# Patient Record
Sex: Male | Born: 1980 | Race: Black or African American | Hispanic: No | Marital: Married | Smoking: Never smoker
Health system: Southern US, Community
[De-identification: ages and names within clinical notes are randomized; demographics above are authoritative.]

## PROBLEM LIST (undated history)

## (undated) DIAGNOSIS — I219 Acute myocardial infarction, unspecified: Secondary | ICD-10-CM

## (undated) DIAGNOSIS — I10 Essential (primary) hypertension: Secondary | ICD-10-CM

---

## 2014-03-17 ENCOUNTER — Encounter (HOSPITAL_COMMUNITY): Payer: Self-pay | Admitting: *Deleted

## 2014-03-17 ENCOUNTER — Emergency Department (HOSPITAL_COMMUNITY): Payer: Self-pay

## 2014-03-17 ENCOUNTER — Emergency Department (HOSPITAL_COMMUNITY)
Admission: EM | Admit: 2014-03-17 | Discharge: 2014-03-17 | Disposition: A | Discharge status: Home / Self Care | Payer: Self-pay | Attending: Emergency Medicine | Admitting: Emergency Medicine

## 2014-03-17 DIAGNOSIS — W19XXXA Unspecified fall, initial encounter: Secondary | ICD-10-CM

## 2014-03-17 DIAGNOSIS — W1839XA Other fall on same level, initial encounter: Secondary | ICD-10-CM | POA: Insufficient documentation

## 2014-03-17 DIAGNOSIS — Y990 Civilian activity done for income or pay: Secondary | ICD-10-CM | POA: Insufficient documentation

## 2014-03-17 DIAGNOSIS — S4992XA Unspecified injury of left shoulder and upper arm, initial encounter: Secondary | ICD-10-CM | POA: Insufficient documentation

## 2014-03-17 DIAGNOSIS — S46912A Strain of unspecified muscle, fascia and tendon at shoulder and upper arm level, left arm, initial encounter: Secondary | ICD-10-CM

## 2014-03-17 DIAGNOSIS — Y9389 Activity, other specified: Secondary | ICD-10-CM | POA: Insufficient documentation

## 2014-03-17 DIAGNOSIS — Y9289 Other specified places as the place of occurrence of the external cause: Secondary | ICD-10-CM | POA: Insufficient documentation

## 2014-03-17 DIAGNOSIS — S46012A Strain of muscle(s) and tendon(s) of the rotator cuff of left shoulder, initial encounter: Secondary | ICD-10-CM | POA: Insufficient documentation

## 2014-03-17 LAB — BASIC METABOLIC PANEL
Anion gap: 11 (ref 5–15)
BUN: 13 mg/dL (ref 6–23)
CALCIUM: 9.3 mg/dL (ref 8.4–10.5)
CO2: 29 mEq/L (ref 19–32)
CREATININE: 1.25 mg/dL (ref 0.50–1.35)
Chloride: 101 mEq/L (ref 96–112)
GFR calc Af Amer: 86 mL/min — ABNORMAL LOW (ref >90)
GFR, EST NON AFRICAN AMERICAN: 74 mL/min — AB (ref >90)
GLUCOSE: 82 mg/dL (ref 70–99)
Potassium: 3.8 mEq/L (ref 3.7–5.3)
Sodium: 141 mEq/L (ref 137–147)

## 2014-03-17 LAB — CBC
HEMATOCRIT: 44.1 % (ref 39.0–52.0)
HEMOGLOBIN: 15 g/dL (ref 13.0–17.0)
MCH: 29.2 pg (ref 26.0–34.0)
MCHC: 34 g/dL (ref 30.0–36.0)
MCV: 85.8 fL (ref 78.0–100.0)
Platelets: 200 10*3/uL (ref 150–400)
RBC: 5.14 MIL/uL (ref 4.22–5.81)
RDW: 12.3 % (ref 11.5–15.5)
WBC: 8.3 10*3/uL (ref 4.0–10.5)

## 2014-03-17 LAB — I-STAT TROPONIN, ED: Troponin i, poc: 0.01 ng/mL (ref 0.00–0.08)

## 2014-03-17 MED ORDER — OXYCODONE-ACETAMINOPHEN 5-325 MG PO TABS
1.0000 | ORAL_TABLET | Freq: Once | ORAL | Status: AC
  Administered 2014-03-17: 1 via ORAL
  Filled 2014-03-17: qty 1

## 2014-03-17 MED ORDER — IBUPROFEN 600 MG PO TABS: 600.0000 mg | ORAL_TABLET | Freq: Four times a day (QID) | ORAL | Status: DC | PRN

## 2014-03-17 MED ORDER — OXYCODONE-ACETAMINOPHEN 5-325 MG PO TABS: 1.0000 | ORAL_TABLET | Freq: Four times a day (QID) | ORAL | Status: DC | PRN

## 2014-03-17 NOTE — ED Notes (Signed)
Pt states that he has had shoulder pain that radiates to chest and arm since a fall several weeks ago. NAD noted.

## 2014-03-17 NOTE — Discharge Instructions (Signed)
Shoulder Sprain °A shoulder sprain is the result of damage to the tough, fiber-like tissues (ligaments) that help hold your shoulder in place. The ligaments may be stretched or torn. Besides the main shoulder joint (the ball and socket), there are several smaller joints that connect the bones in this area. A sprain usually involves one of those joints. Most often it is the acromioclavicular (or AC) joint. That is the joint that connects the collarbone (clavicle) and the shoulder blade (scapula) at the top point of the shoulder blade (acromion). °A shoulder sprain is a mild form of what is called a shoulder separation. Recovering from a shoulder sprain may take some time. For some, pain lingers for several months. Most people recover without long term problems. °CAUSES  °· A shoulder sprain is usually caused by some kind of trauma. This might be: °¨ Falling on an outstretched arm. °¨ Being hit hard on the shoulder. °¨ Twisting the arm. °· Shoulder sprains are more likely to occur in people who: °¨ Play sports. °¨ Have balance or coordination problems. °SYMPTOMS  °· Pain when you move your shoulder. °· Limited ability to move the shoulder. °· Swelling and tenderness on top of the shoulder. °· Redness or warmth in the shoulder. °· Bruising. °· A change in the shape of the shoulder. °DIAGNOSIS  °Your healthcare provider may: °· Ask about your symptoms. °· Ask about recent activity that might have caused those symptoms. °· Examine your shoulder. You may be asked to do simple exercises to test movement. The other shoulder will be examined for comparison. °· Order some tests that provide a look inside the body. They can show the extent of the injury. The tests could include: °¨ X-rays. °¨ CT (computed tomography) scan. °¨ MRI (magnetic resonance imaging) scan. °RISKS AND COMPLICATIONS °· Loss of full shoulder motion. °· Ongoing shoulder pain. °TREATMENT  °How long it takes to recover from a shoulder sprain depends on how  severe it was. Treatment options may include: °· Rest. You should not use the arm or shoulder until it heals. °· Ice. For 2 or 3 days after the injury, put an ice pack on the shoulder up to 4 times a day. It should stay on for 15 to 20 minutes each time. Wrap the ice in a towel so it does not touch your skin. °· Over-the-counter medicine to relieve pain. °· A sling or brace. This will keep the arm still while the shoulder is healing. °· Physical therapy or rehabilitation exercises. These will help you regain strength and motion. Ask your healthcare provider when it is OK to begin these exercises. °· Surgery. The need for surgery is rare with a sprained shoulder, but some people may need surgery to keep the joint in place and reduce pain. °HOME CARE INSTRUCTIONS  °· Ask your healthcare provider about what you should and should not do while your shoulder heals. °· Make sure you know how to apply ice to the correct area of your shoulder. °· Talk with your healthcare provider about which medications should be used for pain and swelling. °· If rehabilitation therapy will be needed, ask your healthcare provider to refer you to a therapist. If it is not recommended, then ask about at-home exercises. Find out when exercise should begin. °SEEK MEDICAL CARE IF:  °Your pain, swelling, or redness at the joint increases. °SEEK IMMEDIATE MEDICAL CARE IF:  °· You have a fever. °· You cannot move your arm or shoulder. °Document Released: 08/20/2008 Document   Revised: 06/26/2011 Document Reviewed: 08/20/2008 °[employer]® Patient Information ©2015 [employer]. This information is not intended to replace advice given to you by your health care provider. Make sure you discuss any questions you have with your health care provider. ° °

## 2014-03-17 NOTE — ED Provider Notes (Signed)
CSN: [id number]     Arrival date & time 03/17/14  1848 History   First MD Initiated Contact with Patient 03/17/14 2253     Chief Complaint  Patient presents with  . Chest Pain  . Shoulder Pain     (Consider location/radiation/quality/duration/timing/severity/associated sxs/prior Treatment) Patient is a 33 y.o. male presenting with chest pain and shoulder pain. The history is provided by the patient.  Chest Pain Associated symptoms: no back pain, no fever and no shortness of breath   Shoulder Pain Associated symptoms: no back pain and no fever    patient has had pain in his left shoulder since a fall 2 weeks ago. States that he was at work and fell over a pit and landed on his left elbow. Since then he has had some pain below his left shoulder. Worse with movement. No tenderness on the chest. No difficulty breathing worse with certain movements. No numbness or weakness. It is not gotten better with menthol lotion. No difficulty breathing. No pain in the elbow. No previous history of problems with the shoulder. There is some radiation down the left arm with the pain.  History reviewed. No pertinent past medical history. History reviewed. No pertinent past surgical history. No family history on file. History  Substance Use Topics  . Smoking status: Never Smoker   . Smokeless tobacco: Not on file  . Alcohol Use: No    Review of Systems  Constitutional: Negative for fever and chills.  Respiratory: Negative for shortness of breath.   Cardiovascular: Positive for chest pain.  Musculoskeletal: Negative for back pain.  Skin: Negative for wound.  Neurological: Negative for syncope.      Allergies  Review of patient's allergies indicates not on file.  Home Medications   Prior to Admission medications   Medication Sig Start Date End Date Taking? Authorizing Provider  ibuprofen (ADVIL,MOTRIN) 600 MG tablet Take 1 tablet (600 mg total) by mouth every 6 (six) hours as needed. 03/17/14    Juliet RudeNathan R. Charlies Rayburn, MD  oxyCODONE-acetaminophen (PERCOCET/ROXICET) 5-325 MG per tablet Take 1-2 tablets by mouth every 6 (six) hours as needed for severe pain. 03/17/14   Juliet RudeNathan R. Keyondra Lagrand, MD   BP 147/94 mmHg  Pulse 58  Temp(Src) 98.4 F (36.9 C) (Oral)  Resp 15  SpO2 100% Physical Exam  Constitutional: He is oriented to person, place, and time. He appears well-developed.  Cardiovascular: Normal rate and regular rhythm.   Pulmonary/Chest: Effort normal.  Abdominal: Soft. There is no tenderness.  Musculoskeletal:  Some pain in the left shoulder with external rotation. Mildly decreased abduction at the shoulder. Sensation intact in the hand. Good muscular control with radial median and ulnar nerve. Flexion and extension in the wrist and elbow intact. No tenderness over elbow. Mild tenderness over the axilla laterally on arm. No tenderness on chest.  Neurological: He is alert and oriented to person, place, and time.  Skin: Skin is warm.    ED Course  Procedures (including critical care time) Labs Review Labs Reviewed  BASIC METABOLIC PANEL - Abnormal; Notable for the following:    GFR calc non Af Amer 74 (*)    GFR calc Af Amer 86 (*)    All other components within normal limits  CBC  I-STAT TROPOININ, ED    Imaging Review Dg Shoulder Left  03/17/2014   CLINICAL DATA:  Fall, acute left shoulder pain  EXAM: LEFT SHOULDER - 2+ VIEW  COMPARISON:  None.  FINDINGS: There is no evidence of  fracture or dislocation. There is no evidence of arthropathy or other focal bone abnormality. Soft tissues are unremarkable.  IMPRESSION: Negative.   Electronically Signed   By: Ruel Favorsrevor  Shick M.D.   On: 03/17/2014 19:54     EKG Interpretation   Date/Time:  Tuesday March 17 2014 19:05:59 EST Ventricular Rate:  58 PR Interval:  152 QRS Duration: 90 QT Interval:  386 QTC Calculation: 378 R Axis:   102 Text Interpretation:  Sinus bradycardia with sinus arrhythmia Rightward  axis Borderline  ECG Confirmed by Rubin PayorPICKERING  MD, Harrold DonathNATHAN [phone]([id number]) on  03/17/2014 10:54:59 PM      MDM   Final diagnoses:  Shoulder strain, left, initial encounter    Patient with left shoulder pain after fall. May be rotator cuff. Reassuring x-ray. This does not appear to be of cardiac cause.    Juliet RudeNathan R. Rubin PayorPickering, MD 03/17/14 2311

## 2015-02-23 ENCOUNTER — Encounter (HOSPITAL_COMMUNITY): Payer: Self-pay | Admitting: Emergency Medicine

## 2015-02-23 ENCOUNTER — Emergency Department (HOSPITAL_COMMUNITY)
Admission: EM | Admit: 2015-02-23 | Discharge: 2015-02-23 | Disposition: A | Discharge status: Home / Self Care | Payer: Self-pay | Attending: Emergency Medicine | Admitting: Emergency Medicine

## 2015-02-23 ENCOUNTER — Emergency Department (HOSPITAL_COMMUNITY): Payer: Self-pay

## 2015-02-23 DIAGNOSIS — R0602 Shortness of breath: Secondary | ICD-10-CM | POA: Insufficient documentation

## 2015-02-23 DIAGNOSIS — I10 Essential (primary) hypertension: Secondary | ICD-10-CM | POA: Insufficient documentation

## 2015-02-23 DIAGNOSIS — I252 Old myocardial infarction: Secondary | ICD-10-CM | POA: Insufficient documentation

## 2015-02-23 DIAGNOSIS — R079 Chest pain, unspecified: Secondary | ICD-10-CM | POA: Insufficient documentation

## 2015-02-23 HISTORY — DX: Acute myocardial infarction, unspecified: I21.9

## 2015-02-23 HISTORY — DX: Essential (primary) hypertension: I10

## 2015-02-23 LAB — CBC WITH DIFFERENTIAL/PLATELET
BASOS ABS: 0 10*3/uL (ref 0.0–0.1)
BASOS PCT: 0 %
EOS ABS: 0.1 10*3/uL (ref 0.0–0.7)
Eosinophils Relative: 1 %
HCT: 46.9 % (ref 39.0–52.0)
HEMOGLOBIN: 15.7 g/dL (ref 13.0–17.0)
LYMPHS PCT: 20 %
Lymphs Abs: 2.2 10*3/uL (ref 0.7–4.0)
MCH: 29 pg (ref 26.0–34.0)
MCHC: 33.5 g/dL (ref 30.0–36.0)
MCV: 86.7 fL (ref 78.0–100.0)
MONO ABS: 0.6 10*3/uL (ref 0.1–1.0)
Monocytes Relative: 5 %
Neutro Abs: 7.8 10*3/uL — ABNORMAL HIGH (ref 1.7–7.7)
Neutrophils Relative %: 74 %
Platelets: 189 10*3/uL (ref 150–400)
RBC: 5.41 MIL/uL (ref 4.22–5.81)
RDW: 12.9 % (ref 11.5–15.5)
WBC: 10.6 10*3/uL — AB (ref 4.0–10.5)

## 2015-02-23 LAB — I-STAT TROPONIN, ED
TROPONIN I, POC: 0.01 ng/mL (ref 0.00–0.08)
TROPONIN I, POC: 0.02 ng/mL (ref 0.00–0.08)

## 2015-02-23 LAB — BASIC METABOLIC PANEL
Anion gap: 9 (ref 5–15)
BUN: 7 mg/dL (ref 6–20)
CHLORIDE: 103 mmol/L (ref 101–111)
CO2: 29 mmol/L (ref 22–32)
Calcium: 9.3 mg/dL (ref 8.9–10.3)
Creatinine, Ser: 1.39 mg/dL — ABNORMAL HIGH (ref 0.61–1.24)
GFR calc non Af Amer: >60 mL/min (ref >60)
Glucose, Bld: 96 mg/dL (ref 65–99)
Potassium: 4 mmol/L (ref 3.5–5.1)
Sodium: 141 mmol/L (ref 135–145)

## 2015-02-23 MED ORDER — SODIUM CHLORIDE 0.9 % IV BOLUS (SEPSIS)
1000.0000 mL | Freq: Once | INTRAVENOUS | Status: AC
  Administered 2015-02-23: 1000 mL via INTRAVENOUS

## 2015-02-23 MED ORDER — KETOROLAC TROMETHAMINE 60 MG/2ML IM SOLN: 60.0000 mg | Freq: Once | INTRAMUSCULAR | Status: DC

## 2015-02-23 MED ORDER — OXYCODONE-ACETAMINOPHEN 5-325 MG PO TABS: 1.0000 | ORAL_TABLET | Freq: Once | ORAL | Status: DC

## 2015-02-23 MED ORDER — KETOROLAC TROMETHAMINE 30 MG/ML IJ SOLN
30.0000 mg | Freq: Once | INTRAMUSCULAR | Status: AC
  Administered 2015-02-23: 30 mg via INTRAVENOUS
  Filled 2015-02-23: qty 1

## 2015-02-23 NOTE — ED Provider Notes (Signed)
CSN: [id number]     Arrival date & time 02/23/15  [id number] History   First MD Initiated Contact with Patient 02/23/15 0827     Chief Complaint  Patient presents with  . Chest Pain     (Consider location/radiation/quality/duration/timing/severity/associated sxs/prior Treatment) The history is provided by the patient. No language interpreter was used.  Mr. Nathaniel Lawrence is a 34 year old male who presents with sudden onset throbbing left-sided chest pain while at work today approximately one hour ago. 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 He also complains of mild shortness of breath with the chest throbbing. He states he has had chest pain in the past and had a heart attack when he was 34 years old but denies taking any medications. He states he also had a cardiac cath done and was given some papers but does not know what they did to him or if they placed a stent. When asked what his chest pain was on a scale of 1-10 he stated he was not in pain but that it was more throbbing in nature. He denies any treatment prior to arrival. He said he has high blood pressure but denies taking any medication for this. He denies any diaphoresis, fever, chills, recent illness, abdominal pain, nausea, vomiting, diarrhea, or dysuria. He denies any tobacco use. He denies any significant family history of heart disease.  Past Medical History  Diagnosis Date  . Myocardial infarction acute (HCC)   . Hypertension    History reviewed. No pertinent past surgical history. No family history on file. Social History  Substance Use Topics  . Smoking status: Never Smoker   . Smokeless tobacco: None  . Alcohol Use: No    Review of Systems  Constitutional: Negative for fever and diaphoresis.  Respiratory: Positive for shortness of breath. Negative for cough.   Cardiovascular: Positive for chest pain.  Gastrointestinal: Negative for vomiting, abdominal pain and constipation.  All other systems reviewed and are negative.     Allergies   Review of patient's allergies indicates no known allergies.  Home Medications   Prior to Admission medications   Medication Sig Start Date End Date Taking? Authorizing Provider  aspirin-acetaminophen-caffeine (EXCEDRIN MIGRAINE) [phone] MG tablet Take 1 tablet by mouth every 6 (six) hours as needed for headache.   Yes Historical Provider, MD  ibuprofen (ADVIL,MOTRIN) 600 MG tablet Take 1 tablet (600 mg total) by mouth every 6 (six) hours as needed. Patient not taking: Reported on 02/23/2015 03/17/14   Benjiman CoreNathan Pickering, MD  oxyCODONE-acetaminophen (PERCOCET/ROXICET) 5-325 MG per tablet Take 1-2 tablets by mouth every 6 (six) hours as needed for severe pain. Patient not taking: Reported on 02/23/2015 03/17/14   Benjiman CoreNathan Pickering, MD   BP 91/74 mmHg  Pulse 71  Temp(Src) 98.1 F (36.7 C) (Oral)  Resp 18  Ht 5\' 8"  (1.727 m)  Wt 180 lb (81.647 kg)  BMI 27.38 kg/m2  SpO2 99% Physical Exam  Constitutional: He is oriented to person, place, and time. He appears well-developed and well-nourished. No distress.  HENT:  Head: Normocephalic and atraumatic.  Eyes: Conjunctivae are normal.  Neck: Normal range of motion. Neck supple.  Cardiovascular: Normal rate, regular rhythm and normal heart sounds.   Regular rate and rhythm. No murmurs rubs or gallops.  Pulmonary/Chest: Effort normal and breath sounds normal. No respiratory distress. He has no wheezes. He has no rales.  Lungs clear to auscultation bilaterally. No wheezing or decreased breath sounds. No signs of respiratory distress or shortness of breath. 99% oxygen on  room air. No reproducible chest tenderness to palpation.  Abdominal: Soft. There is no tenderness.  No abdominal tenderness to palpation.  Musculoskeletal: Normal range of motion.  Neurological: He is alert and oriented to person, place, and time.  Skin: Skin is warm and dry.  Nursing note and vitals reviewed.   ED Course  Procedures (including critical care time) Labs  Review Labs Reviewed  CBC WITH DIFFERENTIAL/PLATELET - Abnormal; Notable for the following:    WBC 10.6 (*)    Neutro Abs 7.8 (*)    All other components within normal limits  BASIC METABOLIC PANEL - Abnormal; Notable for the following:    Creatinine, Ser 1.39 (*)    All other components within normal limits  I-STAT TROPOININ, ED  Rosezena Sensor, ED    Imaging Review Dg Chest 2 View  02/23/2015  CLINICAL DATA:  34 year old male with central chest pain radiating to the left side with shortness of breath and intermittent nausea. Initial encounter. EXAM: CHEST  2 VIEW COMPARISON:  None. FINDINGS: Normal lung volumes. Normal cardiac size and mediastinal contours. Visualized tracheal air column is within normal limits. The lungs are clear. Negative visible bowel gas pattern. No osseous abnormality identified. IMPRESSION: Negative, no acute cardiopulmonary abnormality. Electronically Signed   By: Odessa Fleming M.D.   On: 02/23/2015 09:31   I have personally reviewed and evaluated these images and lab results as part of my medical decision-making.   EKG Interpretation   Date/Time:  Tuesday February 23 2015 08:30:57 EST Ventricular Rate:  73 PR Interval:  146 QRS Duration: 91 QT Interval:  366 QTC Calculation: 403 R Axis:   91 Text Interpretation:  Sinus arrhythmia Borderline right axis deviation  Confirmed by BEATON  MD, ROBERT ([id number]) on 02/23/2015 8:40:58 AM      MDM   Final diagnoses:  Chest pain, unspecified chest pain type  Patient presents for throbbing chest pain that began one hour prior to arrival while at work. His vitals are stable and he is well-appearing. He does not appear to be in any respiratory or acute distress at this time. His labs are not concerning. He is mildly dehydrated. His chest x-ray is negative for edema, infiltrate, or pneumothorax. His EKG shows no ST elevation. His troponin is negative. Due to the onset and timing of the pain he will need repeat  troponin. Medications  ketorolac (TORADOL) 30 MG/ML injection 30 mg (30 mg Intravenous Given 02/23/15 0911)  sodium chloride 0.9 % bolus 1,000 mL (0 mLs Intravenous Stopped 02/23/15 1052)   Patient's second troponin is negative. He requested hypertension medication but has never been given any in the past. His blood pressures remained stable while he stayed here. I discussed that he would need to see a primary care physician to determine if he needed blood pressure medication. I reviewed the heart score which was very low risk for acute coronary syndrome. I do not believe this is a PE. I discussed return precautions with the patient as well as follow-up and he verbally agrees with the plan.    Catha Gosselin, PA-C 02/23/15 1500  Nelva Nay, MD 02/24/15 [phone]

## 2015-02-23 NOTE — Discharge Instructions (Signed)
Nonspecific Chest Pain  Follow up with a primary care provider using the resource guide below. Return for chest pain, shortness of breath, or sweating with chest pain. Chest pain can be caused by many different conditions. There is always a chance that your pain could be related to something serious, such as a heart attack or a blood clot in your lungs. Chest pain can also be caused by conditions that are not life-threatening. If you have chest pain, it is very important to follow up with your health care provider. CAUSES  Chest pain can be caused by:  Heartburn.  Pneumonia or bronchitis.  Anxiety or stress.  Inflammation around your heart (pericarditis) or lung (pleuritis or pleurisy).  A blood clot in your lung.  A collapsed lung (pneumothorax). It can develop suddenly on its own (spontaneous pneumothorax) or from trauma to the chest.  Shingles infection (varicella-zoster virus).  Heart attack.  Damage to the bones, muscles, and cartilage that make up your chest wall. This can include:  Bruised bones due to injury.  Strained muscles or cartilage due to frequent or repeated coughing or overwork.  Fracture to one or more ribs.  Sore cartilage due to inflammation (costochondritis). RISK FACTORS  Risk factors for chest pain may include:  Activities that increase your risk for trauma or injury to your chest.  Respiratory infections or conditions that cause frequent coughing.  Medical conditions or overeating that can cause heartburn.  Heart disease or family history of heart disease.  Conditions or health behaviors that increase your risk of developing a blood clot.  Having had chicken pox (varicella zoster). SIGNS AND SYMPTOMS Chest pain can feel like:  Burning or tingling on the surface of your chest or deep in your chest.  Crushing, pressure, aching, or squeezing pain.  Dull or sharp pain that is worse when you move, cough, or take a deep breath.  Pain that is  also felt in your back, neck, shoulder, or arm, or pain that spreads to any of these areas. Your chest pain may come and go, or it may stay constant. DIAGNOSIS Lab tests or other studies may be needed to find the cause of your pain. Your health care provider may have you take a test called an ambulatory ECG (electrocardiogram). An ECG records your heartbeat patterns at the time the test is performed. You may also have other tests, such as:  Transthoracic echocardiogram (TTE). During echocardiography, sound waves are used to create a picture of all of the heart structures and to look at how blood flows through your heart.  Transesophageal echocardiogram (TEE).This is a more advanced imaging test that obtains images from inside your body. It allows your health care provider to see your heart in finer detail.  Cardiac monitoring. This allows your health care provider to monitor your heart rate and rhythm in real time.  Holter monitor. This is a portable device that records your heartbeat and can help to diagnose abnormal heartbeats. It allows your health care provider to track your heart activity for several days, if needed.  Stress tests. These can be done through exercise or by taking medicine that makes your heart beat more quickly.  Blood tests.  Imaging tests. TREATMENT  Your treatment depends on what is causing your chest pain. Treatment may include:  Medicines. These may include:  Acid blockers for heartburn.  Anti-inflammatory medicine.  Pain medicine for inflammatory conditions.  Antibiotic medicine, if an infection is present.  Medicines to dissolve blood clots.  Medicines to treat coronary artery disease.  Supportive care for conditions that do not require medicines. This may include:  Resting.  Applying heat or cold packs to injured areas.  Limiting activities until pain decreases. HOME CARE INSTRUCTIONS  If you were prescribed an antibiotic medicine, finish it  all even if you start to feel better.  Avoid any activities that bring on chest pain.  Do not use any tobacco products, including cigarettes, chewing tobacco, or electronic cigarettes. If you need help quitting, ask your health care provider.  Do not drink alcohol.  Take medicines only as directed by your health care provider.  Keep all follow-up visits as directed by your health care provider. This is important. This includes any further testing if your chest pain does not go away.  If heartburn is the cause for your chest pain, you may be told to keep your head raised (elevated) while sleeping. This reduces the chance that acid will go from your stomach into your esophagus.  Make lifestyle changes as directed by your health care provider. These may include:  Getting regular exercise. Ask your health care provider to suggest some activities that are safe for you.  Eating a heart-healthy diet. A registered dietitian can help you to learn healthy eating options.  Maintaining a healthy weight.  Managing diabetes, if necessary.  Reducing stress. SEEK MEDICAL CARE IF:  Your chest pain does not go away after treatment.  You have a rash with blisters on your chest.  You have a fever. SEEK IMMEDIATE MEDICAL CARE IF:   Your chest pain is worse.  You have an increasing cough, or you cough up blood.  You have severe abdominal pain.  You have severe weakness.  You faint.  You have chills.  You have sudden, unexplained chest discomfort.  You have sudden, unexplained discomfort in your arms, back, neck, or jaw.  You have shortness of breath at any time.  You suddenly start to sweat, or your skin gets clammy.  You feel nauseous or you vomit.  You suddenly feel light-headed or dizzy.  Your heart begins to beat quickly, or it feels like it is skipping beats. These symptoms may represent a serious problem that is an emergency. Do not wait to see if the symptoms will go away.  Get medical help right away. Call your local emergency services (911 in the U.S.). Do not drive yourself to the hospital.   This information is not intended to replace advice given to you by your health care provider. Make sure you discuss any questions you have with your health care provider.   Document Released: 01/11/2005 Document Revised: 04/24/2014 Document Reviewed: 11/07/2013 Elsevier Interactive Patient Education 2016 [employer].  Emergency Department Resource Guide 1) Find a Doctor and Pay Out of Pocket Although you won't have to find out who is covered by your insurance plan, it is a good idea to ask around and get recommendations. You will then need to call the office and see if the doctor you have chosen will accept you as a new patient and what types of options they offer for patients who are self-pay. Some doctors offer discounts or will set up payment plans for their patients who do not have insurance, but you will need to ask so you aren't surprised when you get to your appointment.  2) Contact Your Local Health Department Not all health departments have doctors that can see patients for sick visits, but many do, so it is worth a  call to see if yours does. If you don't know where your local health department is, you can check in your phone book. The CDC also has a tool to help you locate your state's health department, and many state websites also have listings of all of their local health departments.  3) Find a Walk-in Clinic If your illness is not likely to be very severe or complicated, you may want to try a walk in clinic. These are popping up all over the country in pharmacies, drugstores, and shopping centers. They're usually staffed by nurse practitioners or physician assistants that have been trained to treat common illnesses and complaints. They're usually fairly quick and inexpensive. However, if you have serious medical issues or chronic medical problems, these are  probably not your best option.  No Primary Care Doctor: - Call Health Connect at  [phone] - they can help you locate a primary care doctor that  accepts your insurance, provides certain services, etc. - Physician Referral Service- [phone]  Chronic Pain Problems: Organization         Address  Phone   Notes  [facility]  [phone] Patients need to be referred by their primary care doctor.   Medication Assistance: Organization         Address  Phone   Notes  [facility] Medication [facility] [address] [place], [place] [address] [phone] --Must be a resident of [facility] -- Must have NO insurance coverage whatsoever (no Medicaid/ Medicare, etc.) -- The pt. MUST have a primary care doctor that directs their care regularly and follows them in the community   [facility]  [phone]   [employer]  [phone]    Agencies that provide inexpensive medical care: Organization         Address  Phone   Notes  [facility]  [phone]   [facility]    [phone]   [facility] [address] [place], [place] [address] [phone]   [facility] [address] [place] [place] [phone]   Planned Parenthood    [phone]   [facility]    [phone]   [facility]  [address], [place] Phone:  [phone], Fax:  [phone] Hours of Operation:  9 am - 6 pm, M-F.  Also accepts Medicaid/Medicare and self-pay.  [facility]  [address], [place] Phone: [phone], Fax: [phone]. Hours of Operation:  8:30 am - 5:30 pm, M-F.  Also accepts Medicaid and self-pay.  [facility] [place] [address] [place] Phone: [phone]   [facility] [address] Natasha Bence [place], [place] [phone], Ext. [phone] Mondays & Thursdays:  7-9 AM.  First 15 patients are seen on a first come, first serve basis.    Medicaid-accepting [facility] Providers:  Organization         Address  Phone   Notes  [facility] [address], [place] [phone] Also accepts self-pay patients.  [facility] [address] Laurell Josephs [place], [place]  [phone]   [facility] [address], [place] [phone]   [facility] [address] [place] [phone]   Renaye Rakers [address] [place],  [place]   [phone]) [phone] Only accepts [place] Goldman Sachsccess Medicaid patients after they have their name applied to their card.   Self-Pay (no insurance) in [facility]:  Organization         Address  Phone   Notes  Sickle Cell Patients, [facility] [address], [place] [phone]) [phone]   [facility] [address], [place] [phone]   [facility]  [address], [place] [phone]) [phone]   [facility]/Dr. Osei-Bonsu  [address] [place] [address], [place] or [phone], [place] [phone]) [phone] Phone number for both [place] and [place] locations is the same.  [facility] [address], [place] [phone]   [facility]  [address] [place] [address], [place] or [address] [phone]) [phone]) [phone]   [facility] [address], [place] [phone](336) [phone], phone; [phone](336) [phone], fax Sees patients 1st and 3rd Saturday of every month.  Must not qualify for public or private insurance (i.e. Medicaid, Medicare, [place] Health Choice, Veterans' Benefits)  Household income should be no more than 200% of the poverty level The clinic cannot treat you if you are pregnant or think you are pregnant  Sexually transmitted diseases are not treated at the clinic.     Dental Care: Organization         Address  Phone  Notes  [facility] [address], [place] [phone]) [phone] Accepts children up to age 35 who are enrolled in [place]icaid or [place] Health Choice; pregnant women with a Medicaid card; and children who have applied for Medicaid or [place] Health Choice, but were declined, whose parents can pay a reduced fee at time of service.  [facility] [place]  [address], [place] [phone]) [phone] Accepts children up to age 35 who are enrolled in [place]icaid or [place] Health Choice; pregnant women with a Medicaid card; and children who have applied for Medicaid or [place] Health Choice, but were declined, whose parents can pay a reduced fee at time of service.  [facility]  [address], [place] [phone] Patients are seen by appointment only. Walk-ins are not accepted. [facility] will see patients 35 years of age and older. Monday - Tuesday (8am-5pm) Most Wednesdays (8:30-5pm) $30 per visit, cash only  [facility]  [address], [facility] [phone] Patients are seen by appointment only. Walk-ins are not accepted. [facility] will see patients 35 years of age and older. One Wednesday Evening (Monthly: Volunteer Based).  $30 per visit, cash only  [employer] [facility] [facility]  [phone]) [phone] for adults; Children under age 494, call Graduate Pediatric Dentistry at [phone]. Children aged 494-14, please call [phone]) [phone] to request a pediatric application.  Dental services are provided in all areas of dental care including fillings, crowns and bridges, complete and partial dentures, implants, gum treatment, root canals, and extractions. Preventive care is also provided. Treatment is provided to both adults and children. Patients are selected via a lottery and there is often a waiting  list.   [facility] [address], [place]  [phone]) [phone] [email or web address]   [facility] [address], [place], [place] [phone], Ext. [phone] Second and Fourth Thursday of each month,  opens at 6:30 AM; Clinic ends at 9 AM.  Patients are seen on a first-come first-served basis, and a limited number are seen during each clinic.   [facility]  [address], [place], [place] [phone]) [phone]   Eligibility Requirements You must have lived in [place], [place], or [place] counties for at least the last three months.   You cannot be eligible for state or federal sponsored National Cityhealthcare insurance, including CIGNAVeterans Administration, [place]icaid, or [employer] EntertainmentMedicare.   You generally cannot be eligible for healthcare insurance through your employer.    How to apply: Eligibility screenings are held every Tuesday and Wednesday afternoon from 1:00 pm until 4:00 pm. You do not need an appointment for the interview!  [facility] [address], [place], [place] [phone]   [facility]  [phone]   [facility]  [phone]   [facility]  [phone]    Behavioral Health Resources in the Community: Intensive Outpatient Programs Organization         Address  Phone  Notes  [facility] [address], [place], [place] [phone]   [facility] [address], [place], [place] [phone]   [facility] [address], [place], [place]  [phone]   [facility] [address],  [place], [place] [phone] or [phone]   Substance Abuse Resources Organization         Address  Phone  Notes  [facility]  [phone]   [facility]  [phone]   [facility]  [phone]   [facility]  [phone]   Residential & Outpatient Substance Abuse Program   [phone]   Psychological Services Organization         Address  Phone  Notes  [facility]  [phone]- [phone]   [facility]  [phone]- [phone]   [facility] [address], [place] [phone] or [phone]    Mobile Crisis Teams Organization         Address  Phone  Notes  [facility], Mobile Crisis Care Unit  [phone]   [facility]  [address] [place], [place] [phone]   Doristine LocksSharon DeEsch [address] [place] [phone]    Self-Help/Support Groups Organization         Address  Phone             Notes  [facility] [place] - variety of support groups  336- [phone] Call for more information  Narcotics Anonymous (NA), [facility] [address], [place]  2 meetings at this location   Statisticianesidential Treatment Programs Organization         Address  Phone  Notes  [facility] [address],    [place]  [phone]   [facility]  [address], [place] [phone], [place], [place] [phone]   [facility] [address], [place] [phone] Admissions: 8am-3pm M-F  [facility] [address]    [place], [place] [phone]   [facility] [address], [place], [place] [phone]   [facility] [address]  [place], [place] [phone]   [facility] - Intensive Outpatient [address], [place], [place] [phone]   [facility] [address]  [place], [place] [phone]  or [phone]   [facility] ([facility]) [address], [place], [place] [phone] Accepts Medicaid  [facility] [place] [address],  [place] [phone] Substance Abuse/Addiction Treatment   [facility] Organization          Address  Phone  Notes  [facility]  [phone]   Angie Fava, PhD [address] Ervin Knack [place], [place]   [phone] or [phone]   [facility]   [address] [place], [place] [phone]   [facility] [address], [place], [place] [phone] Insurance/Medicaid/sponsorship through [facility] and Families [address]                                    [place], [place] [phone] Therapy/tele-psych/case  [facility] [address] Road[address], [place] [phone]    Dr. Lolly Mustache  [phone]   [facility]  [employer] [facility] Dept. 1) [address] [place] 2) [address], [place] 3)  [address], [place] [phone]  [phone]   [facility] Child Abuse Hotline [phone] or [phone] (After Hours)

## 2015-02-23 NOTE — ED Notes (Signed)
Patient states was at work today and had L chest pain that was throbbing in nature.   Patient states he is still having small amount of pain.  Patient states had a heart attack when he was 27 that was caused by stress.   Denies other symptoms.

## 2015-02-25 ENCOUNTER — Ambulatory Visit (INDEPENDENT_AMBULATORY_CARE_PROVIDER_SITE_OTHER): Payer: Self-pay | Admitting: Family Medicine

## 2015-02-25 VITALS — BP 120/88 | HR 58 | Temp 98.2°F | Resp 18 | Ht 68.0 in | Wt 188.6 lb

## 2015-02-25 DIAGNOSIS — N289 Disorder of kidney and ureter, unspecified: Secondary | ICD-10-CM

## 2015-02-25 DIAGNOSIS — Z09 Encounter for follow-up examination after completed treatment for conditions other than malignant neoplasm: Secondary | ICD-10-CM

## 2015-02-25 DIAGNOSIS — R030 Elevated blood-pressure reading, without diagnosis of hypertension: Secondary | ICD-10-CM

## 2015-02-25 NOTE — Patient Instructions (Signed)
Blood pressure should be less 140/90  Nonspecific Chest Pain  Chest pain can be caused by many different conditions. There is always a chance that your pain could be related to something serious, such as a heart attack or a blood clot in your lungs. Chest pain can also be caused by conditions that are not life-threatening. If you have chest pain, it is very important to follow up with your health care provider. CAUSES  Chest pain can be caused by:  Heartburn.  Pneumonia or bronchitis.  Anxiety or stress.  Inflammation around your heart (pericarditis) or lung (pleuritis or pleurisy).  A blood clot in your lung.  A collapsed lung (pneumothorax). It can develop suddenly on its own (spontaneous pneumothorax) or from trauma to the chest.  Shingles infection (varicella-zoster virus).  Heart attack.  Damage to the bones, muscles, and cartilage that make up your chest wall. This can include:  Bruised bones due to injury.  Strained muscles or cartilage due to frequent or repeated coughing or overwork.  Fracture to one or more ribs.  Sore cartilage due to inflammation (costochondritis). RISK FACTORS  Risk factors for chest pain may include:  Activities that increase your risk for trauma or injury to your chest.  Respiratory infections or conditions that cause frequent coughing.  Medical conditions or overeating that can cause heartburn.  Heart disease or family history of heart disease.  Conditions or health behaviors that increase your risk of developing a blood clot.  Having had chicken pox (varicella zoster). SIGNS AND SYMPTOMS Chest pain can feel like:  Burning or tingling on the surface of your chest or deep in your chest.  Crushing, pressure, aching, or squeezing pain.  Dull or sharp pain that is worse when you move, cough, or take a deep breath.  Pain that is also felt in your back, neck, shoulder, or arm, or pain that spreads to any of these areas. Your chest pain  may come and go, or it may stay constant. DIAGNOSIS Lab tests or other studies may be needed to find the cause of your pain. Your health care provider may have you take a test called an ambulatory ECG (electrocardiogram). An ECG records your heartbeat patterns at the time the test is performed. You may also have other tests, such as:  Transthoracic echocardiogram (TTE). During echocardiography, sound waves are used to create a picture of all of the heart structures and to look at how blood flows through your heart.  Transesophageal echocardiogram (TEE).This is a more advanced imaging test that obtains images from inside your body. It allows your health care provider to see your heart in finer detail.  Cardiac monitoring. This allows your health care provider to monitor your heart rate and rhythm in real time.  Holter monitor. This is a portable device that records your heartbeat and can help to diagnose abnormal heartbeats. It allows your health care provider to track your heart activity for several days, if needed.  Stress tests. These can be done through exercise or by taking medicine that makes your heart beat more quickly.  Blood tests.  Imaging tests. TREATMENT  Your treatment depends on what is causing your chest pain. Treatment may include:  Medicines. These may include:  Acid blockers for heartburn.  Anti-inflammatory medicine.  Pain medicine for inflammatory conditions.  Antibiotic medicine, if an infection is present.  Medicines to dissolve blood clots.  Medicines to treat coronary artery disease.  Supportive care for conditions that do not require medicines. This  may include:  Resting.  Applying heat or cold packs to injured areas.  Limiting activities until pain decreases. HOME CARE INSTRUCTIONS  If you were prescribed an antibiotic medicine, finish it all even if you start to feel better.  Avoid any activities that bring on chest pain.  Do not use any  tobacco products, including cigarettes, chewing tobacco, or electronic cigarettes. If you need help quitting, ask your health care provider.  Do not drink alcohol.  Take medicines only as directed by your health care provider.  Keep all follow-up visits as directed by your health care provider. This is important. This includes any further testing if your chest pain does not go away.  If heartburn is the cause for your chest pain, you may be told to keep your head raised (elevated) while sleeping. This reduces the chance that acid will go from your stomach into your esophagus.  Make lifestyle changes as directed by your health care provider. These may include:  Getting regular exercise. Ask your health care provider to suggest some activities that are safe for you.  Eating a heart-healthy diet. A registered dietitian can help you to learn healthy eating options.  Maintaining a healthy weight.  Managing diabetes, if necessary.  Reducing stress. SEEK MEDICAL CARE IF:  Your chest pain does not go away after treatment.  You have a rash with blisters on your chest.  You have a fever. SEEK IMMEDIATE MEDICAL CARE IF:   Your chest pain is worse.  You have an increasing cough, or you cough up blood.  You have severe abdominal pain.  You have severe weakness.  You faint.  You have chills.  You have sudden, unexplained chest discomfort.  You have sudden, unexplained discomfort in your arms, back, neck, or jaw.  You have shortness of breath at any time.  You suddenly start to sweat, or your skin gets clammy.  You feel nauseous or you vomit.  You suddenly feel light-headed or dizzy.  Your heart begins to beat quickly, or it feels like it is skipping beats. These symptoms may represent a serious problem that is an emergency. Do not wait to see if the symptoms will go away. Get medical help right away. Call your local emergency services (911 in the [place] Do not drive yourself  to the hospital.   This information is not intended to replace advice given to you by your health care provider. Make sure you discuss any questions you have with your health care provider.   Document Released: 01/11/2005 Document Revised: 04/24/2014 Document Reviewed: 11/07/2013 Elsevier Interactive Patient Education [employer]

## 2015-02-25 NOTE — Progress Notes (Signed)
Chief Complaint:  Chief Complaint  Patient presents with  . Hospitalization Follow-up    bp    HPI: Nathaniel Lawrence is a 34 y.o. male who reports to [facility] today complaining of  ED fu, He was there for CP and is currently asymptomatic and doing well. CP work up negative in ER   He is not having chest pain currently, he feels like earlier he had some pressure and throbbing pain. He had a headache but no nausea or vomiting. He was seen in ED and 2 sets of troponin were normal  His BP 144/92, 149/93, 153/91in the ER at [facility] ER  He is not sure if he had a heart attack at [facility] in [place] in 2010, he was supposed to get a pacemaker but did not have unsurance so sdid not get it.He is not sure if he had a heart attack when he was at [facility], they did something through his groin but no dc with meds.  He denies diabetes, hyperlipidemia, thyroid disease, early family hx of heart disease; he is a non smoker. He does not drink or use illicit drugs   BP Readings from Last 3 Encounters:  02/25/15 120/88  02/23/15 91/74  03/17/14 145/95   Wt Readings from Last 3 Encounters:  02/25/15 188 lb 9.6 oz (85.548 kg)  02/23/15 180 lb (81.647 kg)    Past Medical History  Diagnosis Date  . Myocardial infarction acute (HCC)   . Hypertension    History reviewed. No pertinent past surgical history. Social History   Social History  . Marital Status: Single    Spouse Name: N/A  . Number of Children: N/A  . Years of Education: N/A   Social History Main Topics  . Smoking status: Never Smoker   . Smokeless tobacco: None  . Alcohol Use: No  . Drug Use: No  . Sexual Activity: Not Asked   Other Topics Concern  . None   Social History Narrative   History reviewed. No pertinent family history. No Known Allergies Prior to Admission medications   Medication Sig Start Date End Date Taking? Authorizing Provider  aspirin-acetaminophen-caffeine (EXCEDRIN MIGRAINE) [phone] MG tablet Take 1 tablet  by mouth every 6 (six) hours as needed for headache.   Yes Historical Provider, MD  ibuprofen (ADVIL,MOTRIN) 600 MG tablet Take 1 tablet (600 mg total) by mouth every 6 (six) hours as needed. 03/17/14  Yes Benjiman Core, MD  oxyCODONE-acetaminophen (PERCOCET/ROXICET) 5-325 MG per tablet Take 1-2 tablets by mouth every 6 (six) hours as needed for severe pain. 03/17/14  Yes Benjiman Core, MD     ROS: The patient denies fevers, chills, night sweats, unintentional weight loss, chest pain, palpitations, wheezing, dyspnea on exertion, nausea, vomiting, abdominal pain, dysuria, hematuria, melena, numbness, weakness, or tingling.   All other systems have been reviewed and were otherwise negative with the exception of those mentioned in the HPI and as above.    PHYSICAL EXAM: Filed Vitals:   02/25/15 1626  BP: 120/88  Pulse: 58  Temp: 98.2 F (36.8 C)  Resp: 18   Body mass index is 28.68 kg/(m^2).   General: Alert, no acute distress HEENT:  Normocephalic, atraumatic, oropharynx patent. EOMI, PERRLA, fundo exam normal Cardiovascular:  Regular rate and rhythm, no rubs murmurs or gallops.  No Carotid bruits, radial pulse intact. No pedal edema.  Respiratory: Clear to auscultation bilaterally.  No wheezes, rales, or rhonchi.  No cyanosis, no use of accessory musculature Abdominal: No  organomegaly, abdomen is soft and non-tender, positive bowel sounds. No masses. Skin: No rashes. Neurologic: Facial musculature symmetric. CN 2-12 grossly normal  Psychiatric: Patient acts appropriately throughout our interaction. Lymphatic: No cervical or submandibular lymphadenopathy Musculoskeletal: Gait intact. No edema, tenderness   LABS: Results for orders placed or performed during the hospital encounter of 02/23/15  CBC with Differential  Result Value Ref Range   WBC 10.6 (H) 4.0 - 10.5 K/uL   RBC 5.41 4.22 - 5.81 MIL/uL   Hemoglobin 15.7 13.0 - 17.0 g/dL   HCT 29.5 62.1 - 30.8 %   MCV 86.7 78.0  - 100.0 fL   MCH 29.0 26.0 - 34.0 pg   MCHC 33.5 30.0 - 36.0 g/dL   RDW 65.7 84.6 - 96.2 %   Platelets 189 150 - 400 K/uL   Neutrophils Relative % 74 %   Neutro Abs 7.8 (H) 1.7 - 7.7 K/uL   Lymphocytes Relative 20 %   Lymphs Abs 2.2 0.7 - 4.0 K/uL   Monocytes Relative 5 %   Monocytes Absolute 0.6 0.1 - 1.0 K/uL   Eosinophils Relative 1 %   Eosinophils Absolute 0.1 0.0 - 0.7 K/uL   Basophils Relative 0 %   Basophils Absolute 0.0 0.0 - 0.1 K/uL  Basic metabolic panel  Result Value Ref Range   Sodium 141 135 - 145 mmol/L   Potassium 4.0 3.5 - 5.1 mmol/L   Chloride 103 101 - 111 mmol/L   CO2 29 22 - 32 mmol/L   Glucose, Bld 96 65 - 99 mg/dL   BUN 7 6 - 20 mg/dL   Creatinine, Ser 9.52 (H) 0.61 - 1.24 mg/dL   Calcium 9.3 8.9 - 84.1 mg/dL   GFR calc non Af Amer >60 >60 mL/min   GFR calc Af Amer >60 >60 mL/min   Anion gap 9 5 - 15  I-Stat Troponin, ED (not at [facility])  Result Value Ref Range   Troponin i, poc 0.01 0.00 - 0.08 ng/mL   Comment 3          I-Stat Troponin, ED (not at [facility])  Result Value Ref Range   Troponin i, poc 0.02 0.00 - 0.08 ng/mL   Comment 3             EKG/XRAY:   Primary read interpreted by Dr. Conley Rolls at [facility].   ASSESSMENT/PLAN: Encounter Diagnoses  Name Primary?  . Elevated blood pressure reading without diagnosis of hypertension Yes  . Hospital discharge follow-up   . Abnormal kidney function    Nathaniel Lawrence is a pleasant 34 y.o with a PMH of atypical CP and elevated BP , may have been at [facility] for an MI but sounds more like cardiac cath since he was never dc with any meds in 2010, he is here after DC form [facility] ER for chest pain rusle out. They were unable to give him any BP meds , hes tates he never was on BP meds before. He currently is asymptomatic and BP is WNL.  I have advised him to use his GF's mom;s BP cuff and keep BP and puls elog, if he needs meds we can start him on norvasc since he doe snot like needles and cannot afford to get Blood work every 6  months since no insurance.  Consider Norvasc prn [facility] from [facility] will be obtained, signed release of records.  Fu by phone in 1 week or sooner prn, go to ER prn   Gross sideeffects, risk and benefits,  and alternatives of medications d/w patient. Patient is aware that all medications have potential sideeffects and we are unable to predict every sideeffect or drug-drug interaction that may occur.  Johnattan Strassman DO  02/25/2015 6:30 PM   03/02/15-lm to call us back about BO and pulse logs, if need meds then will prscribe.

## 2015-07-04 ENCOUNTER — Encounter (HOSPITAL_COMMUNITY): Payer: Self-pay | Admitting: *Deleted

## 2015-07-04 ENCOUNTER — Emergency Department (HOSPITAL_COMMUNITY)
Admission: EM | Admit: 2015-07-04 | Discharge: 2015-07-04 | Disposition: A | Discharge status: Home / Self Care | Payer: Self-pay | Attending: Emergency Medicine | Admitting: Emergency Medicine

## 2015-07-04 DIAGNOSIS — I252 Old myocardial infarction: Secondary | ICD-10-CM | POA: Insufficient documentation

## 2015-07-04 DIAGNOSIS — K0381 Cracked tooth: Secondary | ICD-10-CM | POA: Insufficient documentation

## 2015-07-04 DIAGNOSIS — R51 Headache: Secondary | ICD-10-CM | POA: Insufficient documentation

## 2015-07-04 DIAGNOSIS — I10 Essential (primary) hypertension: Secondary | ICD-10-CM | POA: Insufficient documentation

## 2015-07-04 DIAGNOSIS — K029 Dental caries, unspecified: Secondary | ICD-10-CM | POA: Insufficient documentation

## 2015-07-04 DIAGNOSIS — K0889 Other specified disorders of teeth and supporting structures: Secondary | ICD-10-CM | POA: Insufficient documentation

## 2015-07-04 MED ORDER — PENICILLIN V POTASSIUM 500 MG PO TABS: 500.0000 mg | ORAL_TABLET | Freq: Four times a day (QID) | ORAL | Status: AC

## 2015-07-04 MED ORDER — NAPROXEN 500 MG PO TABS: 500.0000 mg | ORAL_TABLET | Freq: Two times a day (BID) | ORAL | Status: DC

## 2015-07-04 MED ORDER — BUPIVACAINE-EPINEPHRINE (PF) 0.5% -1:200000 IJ SOLN
1.8000 mL | Freq: Once | INTRAMUSCULAR | Status: AC
Start: 2015-07-04 — End: 2015-07-04
  Administered 2015-07-04: 1.8 mL
  Filled 2015-07-04: qty 1.8

## 2015-07-04 MED ORDER — NAPROXEN 250 MG PO TABS
500.0000 mg | ORAL_TABLET | Freq: Once | ORAL | Status: AC
  Administered 2015-07-04: 500 mg via ORAL
  Filled 2015-07-04: qty 2

## 2015-07-04 MED ORDER — PENICILLIN V POTASSIUM 250 MG PO TABS
500.0000 mg | ORAL_TABLET | Freq: Once | ORAL | Status: AC
  Administered 2015-07-04: 500 mg via ORAL
  Filled 2015-07-04: qty 2

## 2015-07-04 NOTE — ED Provider Notes (Signed)
CSN: [id number]     Arrival date & time 07/04/15  0129 History   First MD Initiated Contact with Patient 07/04/15 [id number]     Chief Complaint  Patient presents with  . Dental Pain     (Consider location/radiation/quality/duration/timing/severity/associated sxs/prior Treatment) Patient is a 35 y.o. male presenting with tooth pain. The history is provided by the patient. No language interpreter was used.  Dental Pain Location:  Upper Upper teeth location:  15/LU 2nd molar Quality:  Aching, sharp and throbbing Severity:  Moderate Onset quality:  Sudden Duration:  3 days Timing:  Constant Progression:  Waxing and waning Chronicity:  New Context: dental fracture   Context comment:  States he was eating when a part of his tooth broke Relieved by:  Nothing Worsened by:  Touching and pressure Ineffective treatments: excedrin migraine. Associated symptoms: facial pain   Associated symptoms: no difficulty swallowing, no drooling, no facial swelling, no fever, no oral bleeding, no oral lesions and no trismus   Risk factors: lack of dental care   Risk factors: no smoking     Past Medical History  Diagnosis Date  . Myocardial infarction acute (HCC)   . Hypertension    History reviewed. No pertinent past surgical history. No family history on file. Social History  Substance Use Topics  . Smoking status: Never Smoker   . Smokeless tobacco: None  . Alcohol Use: No    Review of Systems  Constitutional: Negative for fever.  HENT: Positive for dental problem. Negative for drooling, facial swelling and mouth sores.   All other systems reviewed and are negative.   Allergies  Review of patient's allergies indicates no known allergies.  Home Medications   Prior to Admission medications   Medication Sig Start Date End Date Taking? Authorizing Provider  naproxen (NAPROSYN) 500 MG tablet Take 1 tablet (500 mg total) by mouth 2 (two) times daily. 07/04/15   Antony Madura, PA-C  penicillin  v potassium (VEETID) 500 MG tablet Take 1 tablet (500 mg total) by mouth 4 (four) times daily. 07/04/15 07/11/15  Antony Madura, PA-C   BP 146/116 mmHg  Pulse 72  Temp(Src) 98.5 F (36.9 C) (Oral)  Resp 16  SpO2 95%   Physical Exam  Constitutional: He is oriented to person, place, and time. He appears well-developed and well-nourished. No distress.  Nontoxic appearing  HENT:  Head: Normocephalic and atraumatic.  Mouth/Throat: Uvula is midline, oropharynx is clear and moist and mucous membranes are normal. No trismus in the jaw. Dental caries present.    No gingival fluctuance or trismus. Uvula midline. Patient tolerating secretions without difficulty. No facial swelling.  Eyes: Conjunctivae and EOM are normal. No scleral icterus.  Neck: Normal range of motion.  No nuchal rigidity or meningismus  Pulmonary/Chest: Effort normal. No respiratory distress.  Musculoskeletal: Normal range of motion.  Neurological: He is alert and oriented to person, place, and time.  Skin: Skin is warm and dry. No rash noted. He is not diaphoretic. No erythema. No pallor.  Psychiatric: He has a normal mood and affect. His behavior is normal.  Nursing note and vitals reviewed.   ED Course  Dental Date/Time: 07/04/2015 2:44 AM Performed by: Antony Madura Authorized by: Antony Madura Consent: The procedure was performed in an emergent situation. Verbal consent obtained. Written consent not obtained. Risks and benefits: risks, benefits and alternatives were discussed Consent given by: patient Patient understanding: patient states understanding of the procedure being performed Patient consent: the patient's understanding of the procedure  matches consent given Procedure consent: procedure consent matches procedure scheduled Relevant documents: relevant documents present and verified Test results: test results available and properly labeled Site marked: the operative site was marked Imaging studies: imaging  studies available Required items: required blood products, implants, devices, and special equipment available Patient identity confirmed: verbally with patient and arm band Time out: Immediately prior to procedure a "time out" was called to verify the correct patient, procedure, equipment, support staff and site/side marked as required. Preparation: Patient was prepped and draped in the usual sterile fashion. Local anesthesia used: yes Anesthesia: nerve block Local anesthetic: bupivacaine 0.5% with epinephrine Anesthetic total: 1 ml Patient sedated: no Patient tolerance: Patient tolerated the procedure well with no immediate complications Comments: PSA block for dentalgia.   (including critical care time) Labs Review Labs Reviewed - No data to display  Imaging Review No results found. I have personally reviewed and evaluated these images and lab results as part of my medical decision-making.   EKG Interpretation None      MDM   Final diagnoses:  Dentalgia    Patient with toothache. No gross abscess. Exam unconcerning for Ludwig's angina or spread of infection. Will treat with penicillin and NSAIDs. Urged patient to follow-up with dentist. Resource guide and return precautions given. Patient discharged in satisfactory condition with no unaddressed concerns.     Antony MaduraKelly Arthor Gorter, PA-C 07/04/15 [id number]  Derwood KaplanAnkit Nanavati, MD 07/04/15 [phone]

## 2015-07-04 NOTE — ED Notes (Signed)
Pt left with all his belongings and ambulated out of the treatment area.  

## 2015-07-04 NOTE — ED Notes (Signed)
The pt is c/o a toothache for 3-4 days swelling lt jaw

## 2015-07-04 NOTE — Discharge Instructions (Signed)
Dental Pain °Dental pain may be caused by many things, including: °· Tooth decay (cavities or caries). Cavities expose the nerve of your tooth to air and hot or cold temperatures. This can cause pain or discomfort. °· Abscess or infection. A dental abscess is a collection of infected pus from a bacterial infection in the inner part of the tooth (pulp). It usually occurs at the end of the tooth's root. °· Injury. °· An unknown reason (idiopathic). °Your pain may be mild or severe. It may only occur when: °· You are chewing. °· You are exposed to hot or cold temperature. °· You are eating or drinking sugary foods or beverages, such as soda or candy. °Your pain may also be constant. °HOME CARE INSTRUCTIONS °Watch your dental pain for any changes. The following actions may help to lessen any discomfort that you are feeling: °· Take medicines only as directed by your dentist. °· If you were prescribed an antibiotic medicine, finish all of it even if you start to feel better. °· Keep all follow-up visits as directed by your dentist. This is important. °· Do not apply heat to the outside of your face. °· Rinse your mouth or gargle with salt water if directed by your dentist. This helps with pain and swelling. °¨ You can make salt water by adding ¼ tsp of salt to 1 cup of warm water. °· Apply ice to the painful area of your face: °¨ Put ice in a plastic bag. °¨ Place a towel between your skin and the bag. °¨ Leave the ice on for 20 minutes, 2-3 times per day. °· Avoid foods or drinks that cause you pain, such as: °¨ Very hot or very cold foods or drinks. °¨ Sweet or sugary foods or drinks. °SEEK MEDICAL CARE IF: °· Your pain is not controlled with medicines. °· Your symptoms are worse. °· You have new symptoms. °SEEK IMMEDIATE MEDICAL CARE IF: °· You are unable to open your mouth. °· You are having trouble breathing or swallowing. °· You have a fever. °· Your face, neck, or jaw is swollen. °  °This information is not  intended to replace advice given to you by your health care provider. Make sure you discuss any questions you have with your health care provider. °  °Document Released: 04/03/2005 Document Revised: 08/18/2014 Document Reviewed: 03/30/2014 °Elsevier Interactive Patient Education ©2016 Elsevier Inc. ° °Community Resource Guide Dental °The United Way’s “211” is a great source of information about community services available.  Access by dialing 2-1-1 from anywhere in [place], or by website -  www.nc211.org.  ° °Other Local Resources (Updated 04/2015) ° °Dental  Care °  °Services ° °  °Phone Number and Address  °Cost  °[facility] For children 0 - 21 years of age:  °• Cleaning °• Tooth brushing/flossing instruction °• Sealants, fillings, crowns °• Extractions °• Emergency treatment  [phone] °[address] °[place], [place] [address] Charges based on family income.  Medicaid and some insurance plans accepted.   °  °[facility] [place] • Cleaning °• Sealants, fillings, crowns °• Extractions °• Emergency treatment [phone] °[address] °[place], [place] ° Pregnant women 18 years of age or older with a Medicaid card  °[facility] [place] • Cleaning °• Sealants, fillings, crowns °• Extractions °• Emergency treatment [phone] °[address] °[place], [place] Pregnant women 18 years of age or older with a   Medicaid card  °[facility] For children 0 - 21 years of age:  °• Cleaning °• Tooth brushing/flossing instruction °• Sealants, fillings, crowns °• Extractions °• Emergency treatment °Limited orthodontic services for patients with Medicaid [phone] °[address] °[place], [place] [address] Medicaid and [place] Health Choice cover for children up to age 21 and pregnant women.  Parents of children up to age 21 without Medicaid pay a  reduced fee at time of service.  °[facility] [place] For children 0 - 21 years of age:  °• Cleaning °• Tooth brushing/flossing instruction °• Sealants, fillings, crowns °• Extractions °• Emergency treatment °Limited orthodontic services for patients with Medicaid [phone] °[address] °[place], [place].  Medicaid and [place] Health Choice cover for children up to age 21 and pregnant women.  Parents of children up to age 21 without Medicaid pay a reduced fee.  °[facility] • Cleaning °• Sealants, fillings, crowns °• Extractions ° °Hours: Tuesdays and Thursdays, 4:15 - 8 pm [phone] °[address] °[place], [place] [address] Services free of charge to [place] residents ages 18-64 who do not have health insurance, Medicare, Medicaid, or VA benefits and fall within federal poverty guidelines  °[facility] ° ° ° Provides dental care in addition to primary medical care, nutritional counseling, and pharmacy: °• Cleaning °• Sealants, fillings, crowns °• Extractions ° ° ° ° ° ° ° ° ° ° ° ° ° ° ° ° ° [phone] °[facility], [address] °[place], [place] ° °[phone] °[facility], [address] [place], [place] ° °[phone] °[facility] °[place], [place] ° °[phone] °[facility], [address] °[place], [place] ° °[phone] °[facility] °[address] °[place], [place] Accepts Medicaid, Medicare, most insurance.  Also provides services available to all with fees adjusted based on ability to pay.    °[facility] • Cleaning °• Tooth brushing/flossing instruction °• Sealants, fillings, crowns °• Extractions °• Emergency treatment °Hours: Tuesdays, Thursdays, and Fridays from 8 am to 5 pm by appointment only. [phone] °[address] °[place], [place]  [address] [place] residents with Medicaid (depending on eligibility) and children with [place] Health Choice - call for more information.  °[facility] • Extractions only ° °Hours: 2nd and 4th Thursday of each month from 6:30 am - 9 am.   [phone] ext. [phone] °[address] °[place], [place] [address] Ages 18 and older only.  Patients are seen on a first come, first served basis.  °[facility] • Cleanings °• Fillings °• Extractions °• Orthodontics °• Endodontics °• Implants/Crowns/Bridges °• Complete and partial dentures [phone] °[place], [place] Patients must complete an application for services.  There is often a waiting list.   ° °

## 2017-09-19 ENCOUNTER — Encounter (HOSPITAL_COMMUNITY): Payer: Self-pay | Admitting: *Deleted

## 2017-09-19 ENCOUNTER — Emergency Department (HOSPITAL_COMMUNITY): Payer: Self-pay

## 2017-09-19 ENCOUNTER — Emergency Department (HOSPITAL_COMMUNITY)
Admission: EM | Admit: 2017-09-19 | Discharge: 2017-09-19 | Disposition: A | Discharge status: Home / Self Care | Payer: Self-pay | Attending: Emergency Medicine | Admitting: Emergency Medicine

## 2017-09-19 DIAGNOSIS — I10 Essential (primary) hypertension: Secondary | ICD-10-CM | POA: Insufficient documentation

## 2017-09-19 DIAGNOSIS — Y999 Unspecified external cause status: Secondary | ICD-10-CM | POA: Insufficient documentation

## 2017-09-19 DIAGNOSIS — I252 Old myocardial infarction: Secondary | ICD-10-CM | POA: Insufficient documentation

## 2017-09-19 DIAGNOSIS — X58XXXA Exposure to other specified factors, initial encounter: Secondary | ICD-10-CM | POA: Insufficient documentation

## 2017-09-19 DIAGNOSIS — Y929 Unspecified place or not applicable: Secondary | ICD-10-CM | POA: Insufficient documentation

## 2017-09-19 DIAGNOSIS — Z79899 Other long term (current) drug therapy: Secondary | ICD-10-CM | POA: Insufficient documentation

## 2017-09-19 DIAGNOSIS — Y9302 Activity, running: Secondary | ICD-10-CM | POA: Insufficient documentation

## 2017-09-19 DIAGNOSIS — S76111A Strain of right quadriceps muscle, fascia and tendon, initial encounter: Secondary | ICD-10-CM | POA: Insufficient documentation

## 2017-09-19 MED ORDER — HYDROCODONE-ACETAMINOPHEN 5-325 MG PO TABS: 1.0000 | ORAL_TABLET | Freq: Four times a day (QID) | ORAL | 0 refills | Status: DC | PRN

## 2017-09-19 MED ORDER — IBUPROFEN 800 MG PO TABS: 800.0000 mg | ORAL_TABLET | Freq: Three times a day (TID) | ORAL | 0 refills | Status: DC | PRN

## 2017-09-19 MED ORDER — OXYCODONE-ACETAMINOPHEN 5-325 MG PO TABS
1.0000 | ORAL_TABLET | Freq: Once | ORAL | Status: AC
  Administered 2017-09-19: 1 via ORAL
  Filled 2017-09-19: qty 1

## 2017-09-19 MED ORDER — MORPHINE SULFATE (PF) 4 MG/ML IV SOLN
4.0000 mg | Freq: Once | INTRAVENOUS | Status: AC
  Administered 2017-09-19: 4 mg via INTRAVENOUS
  Filled 2017-09-19: qty 1

## 2017-09-19 NOTE — ED Triage Notes (Signed)
States he was running last Monday and felt a pain in his right upper thigh. C/o bruising to thigh and states pain is getting worse. Cramping in right thigh

## 2017-09-19 NOTE — ED Notes (Signed)
Pt back from MRI.  States morphine did not touch pain.

## 2017-09-19 NOTE — ED Notes (Signed)
ED Provider at bedside. 

## 2017-09-19 NOTE — Discharge Instructions (Signed)
Return here as needed. Follow up with the orthopedist provided. Ice and heat to the area that is sore. Elevate the leg as well.

## 2017-09-19 NOTE — Progress Notes (Signed)
Orthopedic Tech Progress Note Patient Details:  Erma Pintoric Tennyson January 07, 1981 [id number]  Ortho Devices Type of Ortho Device: Crutches, Knee Immobilizer Ortho Device/Splint Interventions: Application   Post Interventions Patient Tolerated: Well, Ambulated well Instructions Provided: Care of device, Adjustment of device, Poper ambulation with device   Saul FordyceJennifer C Keevin Panebianco 09/19/2017, 1:29 PM

## 2017-09-21 NOTE — ED Provider Notes (Signed)
[facility] EMERGENCY DEPARTMENT Provider Note   CSN: [id number] Arrival date & time: 09/19/17  0425     History   Chief Complaint Chief Complaint  Patient presents with  . Leg Pain    HPI Nathaniel Lawrence is a 37 y.o. male.  HPI Patient presents to the emergency department with pain in the mid thigh after running last week.  The patient states he felt a tearing sensation in the mid thigh with increasing pain towards the knee and bruising and swelling at the knee.  Patient states he is having difficulty with putting any weight on that leg and difficulty raising the leg.  Patient states he did not have any other injury and states he did not fall.  Patient states that movement and palpation make the pain worse.  The patient states he has cramping at nighttime and that thigh. Past Medical History:  Diagnosis Date  . Hypertension   . Myocardial infarction acute (HCC)     There are no active problems to display for this patient.   History reviewed. No pertinent surgical history.      Home Medications    Prior to Admission medications   Medication Sig Start Date End Date Taking? Authorizing Provider  HYDROcodone-acetaminophen (NORCO/VICODIN) 5-325 MG tablet Take 1 tablet by mouth every 6 (six) hours as needed for moderate pain. 09/19/17   Orlanda Lemmerman, Cristal Deer, PA-C  ibuprofen (ADVIL,MOTRIN) 800 MG tablet Take 1 tablet (800 mg total) by mouth every 8 (eight) hours as needed. 09/19/17   Katia Hannen, Cristal Deer, PA-C  naproxen (NAPROSYN) 500 MG tablet Take 1 tablet (500 mg total) by mouth 2 (two) times daily. Patient not taking: Reported on 09/19/2017 07/04/15   Antony Madura, PA-C    Family History No family history on file.  Social History Social History   Tobacco Use  . Smoking status: Never Smoker  . Smokeless tobacco: Never Used  Substance Use Topics  . Alcohol use: No  . Drug use: No     Allergies   Patient has no known allergies.   Review of  Systems Review of Systems All other systems negative except as documented in the HPI. All pertinent positives and negatives as reviewed in the HPI. Physical Exam Updated Vital Signs BP (!) 185/100 (BP Location: Right Arm)   Pulse 92   Temp 98 F (36.7 C) (Oral)   Resp 16   Ht 5\' 8"  (1.727 m)   Wt 86.2 kg (190 lb)   SpO2 100%   BMI 28.89 kg/m   Physical Exam  Constitutional: He is oriented to person, place, and time. He appears well-developed and well-nourished. No distress.  HENT:  Head: Normocephalic and atraumatic.  Eyes: Pupils are equal, round, and reactive to light.  Pulmonary/Chest: Effort normal.  Musculoskeletal:       Legs: Neurological: He is alert and oriented to person, place, and time.  Skin: Skin is warm and dry.  Psychiatric: He has a normal mood and affect.  Nursing note and vitals reviewed.    ED Treatments / Results  Labs (all labs ordered are listed, but only abnormal results are displayed) Labs Reviewed - No data to display  EKG None  Radiology No results found.  Procedures Procedures (including critical care time)  Medications Ordered in ED Medications  oxyCODONE-acetaminophen (PERCOCET/ROXICET) 5-325 MG per tablet 1 tablet (1 tablet Oral Given 09/19/17 0726)  morphine 4 MG/ML injection 4 mg (4 mg Intravenous Given 09/19/17 1040)     Initial Impression /  Assessment and Plan / ED Course  I have reviewed the triage vital signs and the nursing notes.  Pertinent labs & imaging results that were available during my care of the patient were reviewed by me and considered in my medical decision making (see chart for details).    Spoke with the orthopedic PA on call and he advised MRI would further delineate if there is any significant tibiotalar tendon or quadriceps tendon injury.  Patient is found to have no significant knee or thigh involvement at this time.  I did advise him that he would need to follow-up with orthopedics for further evaluation.   Told to ice and elevate the area placed in a knee immobilizer for comfort with crutches.  Feel that this is a tear of the muscle at this point.  Final Clinical Impressions(s) / ED Diagnoses   Final diagnoses:  Quadriceps muscle strain, right, initial encounter    ED Discharge Orders        Ordered    ibuprofen (ADVIL,MOTRIN) 800 MG tablet  Every 8 hours PRN     09/19/17 1248    HYDROcodone-acetaminophen (NORCO/VICODIN) 5-325 MG tablet  Every 6 hours PRN     09/19/17 [address]1248       Demarrius Guerrero, PA-C 09/21/17 1548    Eber HongMiller, Brian, MD 09/22/17 2030

## 2019-04-17 IMAGING — MR MR KNEE*R* W/O CM
4 of 6 series · 21 of 40 positions shown · non-contrast
Comparison: Right femur radiographs 09/19/2017.

CLINICAL DATA: Distal thigh swelling and bruising medially after
injury running several days ago. Limited knee extension. Question
patellar tendon tear.

EXAM:
MRI OF THE RIGHT KNEE WITHOUT CONTRAST
TECHNIQUE: Multiplanar, multisequence MR imaging of the knee was performed. No
intravenous contrast was administered.

[Series 6: PD fat-sat · axial · 3.0mm · 0.31mm/px · z∈[-47,+81]mm · 9 of 38 slices shown (1 of 4)]
[im 1/38]
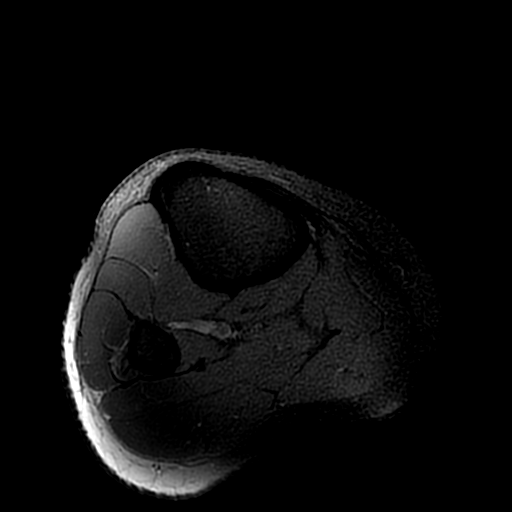
[im 5/38]
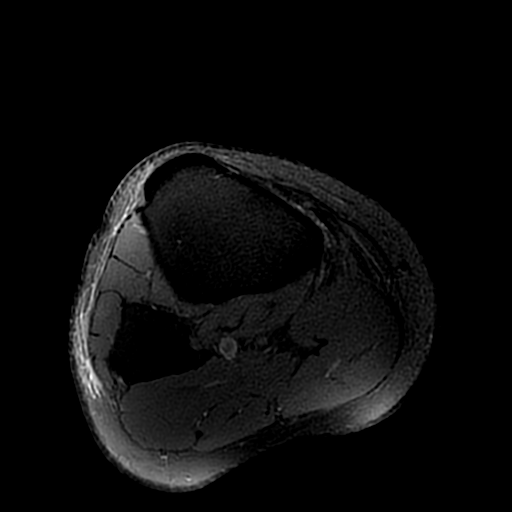
[im 10/38]
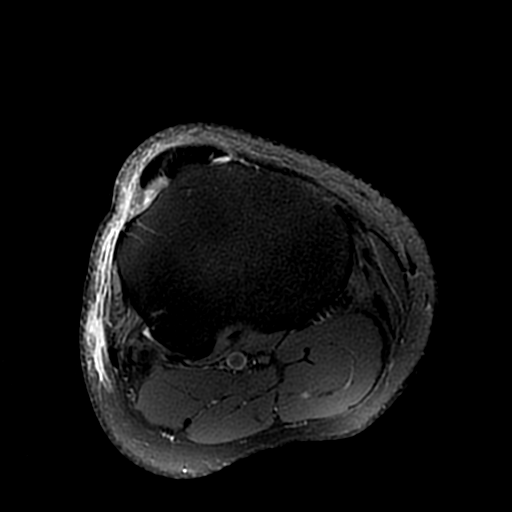
[im 14/38]
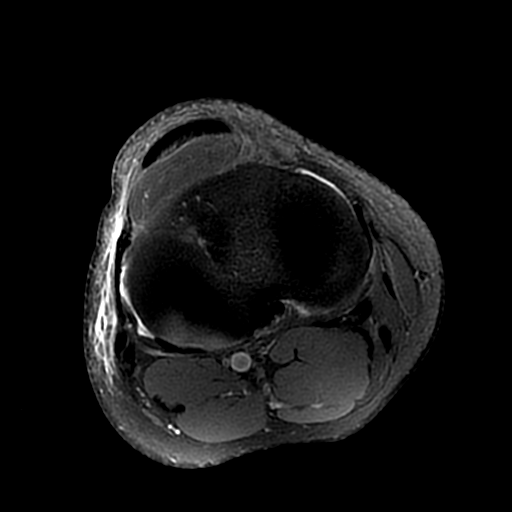
[im 19/38]
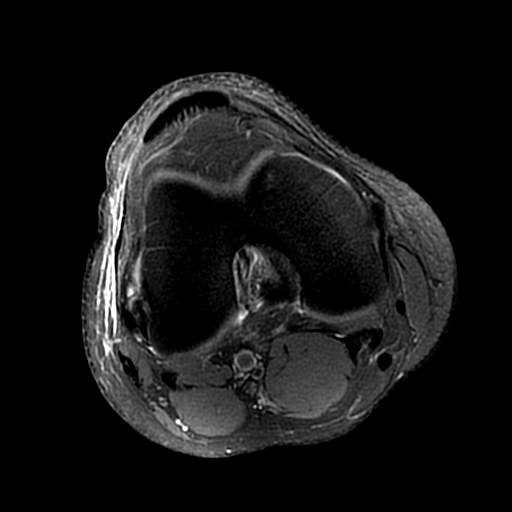
[im 24/38]
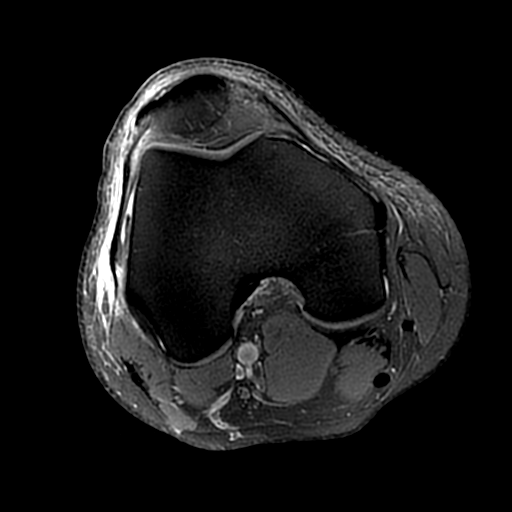
[im 28/38]
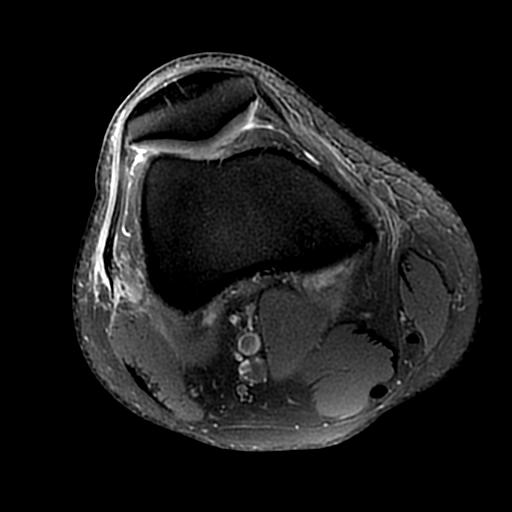
[im 33/38]
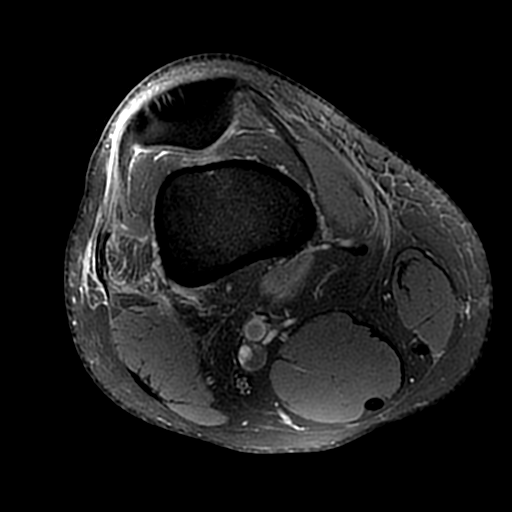
[im 38/38]
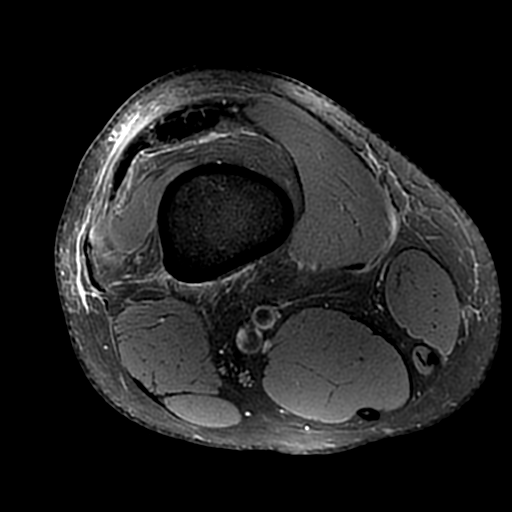

[Series 9: PD fat-sat · sagittal · 3.0mm · 0.31mm/px · 6 of 34 slices shown (2 of 4)]
[im 1/34]
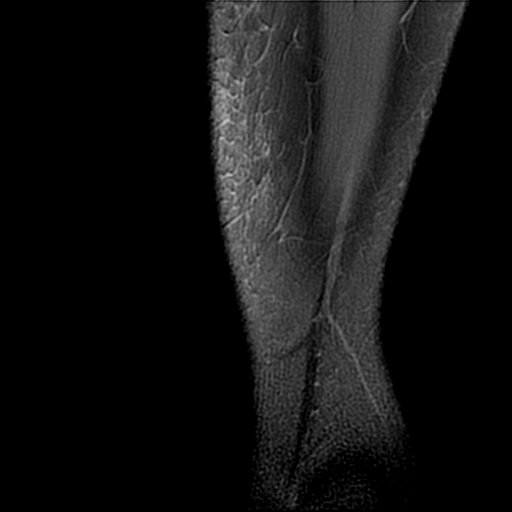
[im 6/34]
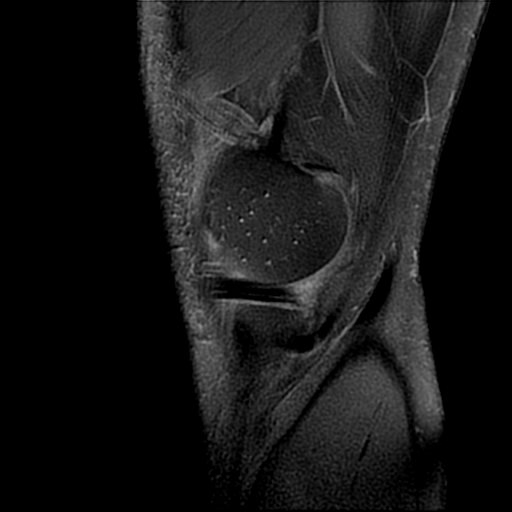
[im 12/34]
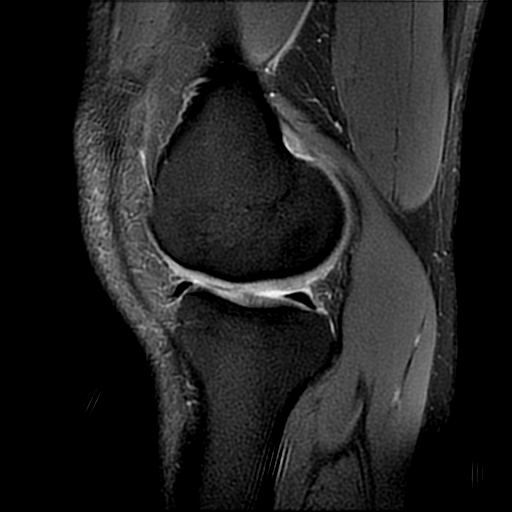
[im 17/34]
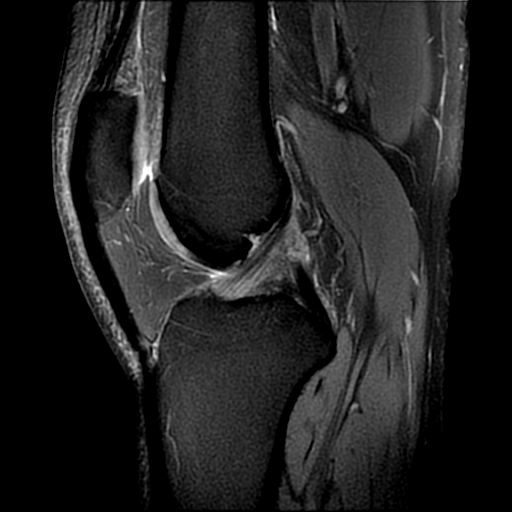
[im 23/34]
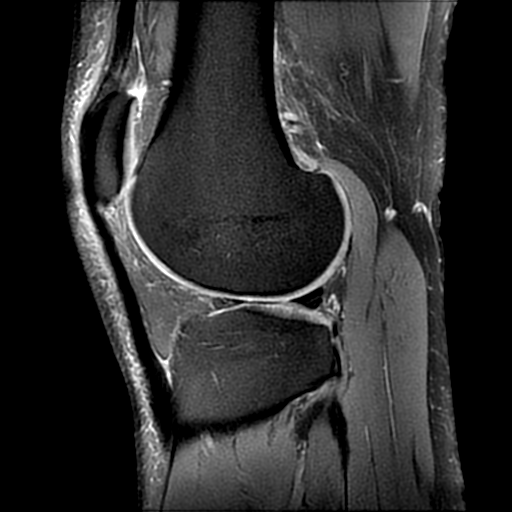
[im 28/34]
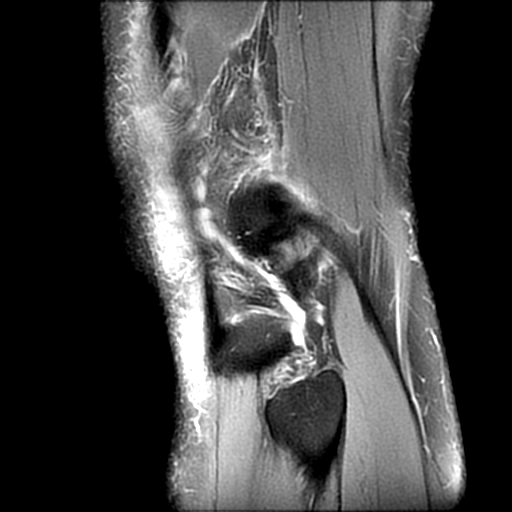

[Series 10: PD fat-sat · coronal · 3.0mm · 0.33mm/px · 3 of 33 slices shown (3 of 4)]
[im 6/33]
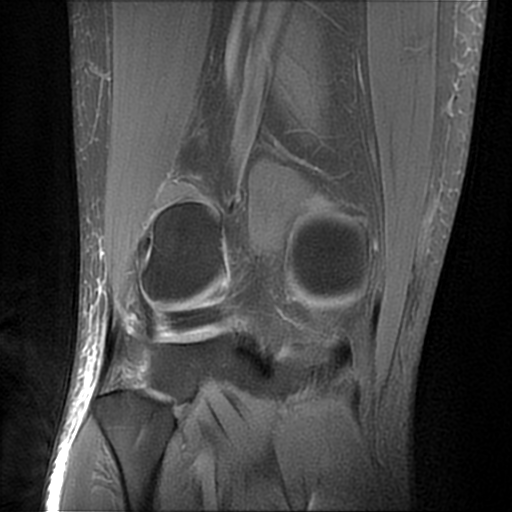
[im 17/33]
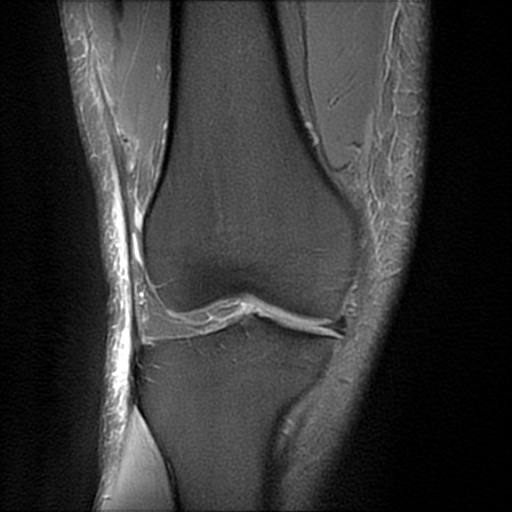
[im 27/33]
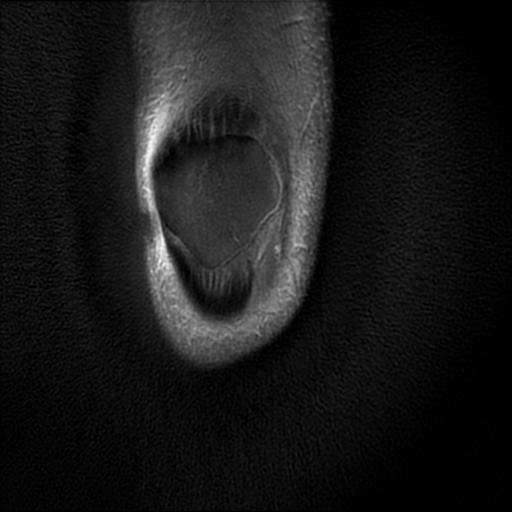

[Series 11: PD fat-sat · oblique · 2.0mm · 0.70mm/px · 3 of 13 slices shown (4 of 4)]
[im 1/13]
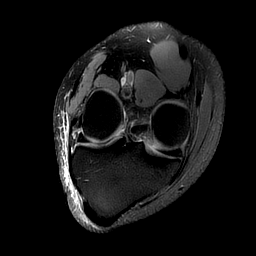
[im 7/13]
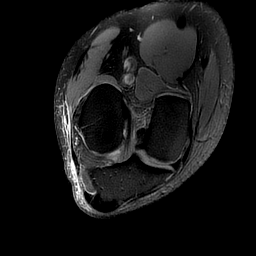
[im 13/13]
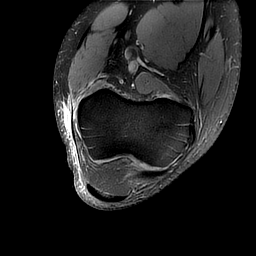

[21 of 40 positions shown; findings below may reference images not displayed]

FINDINGS: MENISCI

Medial meniscus:  Intact with normal morphology.

Lateral meniscus:  Intact with normal morphology.

LIGAMENTS

Cruciates:  Intact.

Collaterals: Intact. There is subcutaneous edema laterally
superficial to the lateral patellar retinaculum and iliotibial band.

CARTILAGE

Patellofemoral: The patellar cartilage is preserved. There is mild
central trochlear chondromalacia and subchondral cyst formation.

Medial:  Preserved.

Lateral:  Preserved.

MISCELLANEOUS

Joint:  No significant joint effusion.

Popliteal Fossa:  Unremarkable. No significant Baker's cyst.

Extensor Mechanism: Intact. The patellar tendon appears normal. The
visualized quadriceps tendon and vastus medialis muscle appear
normal.

Bones:  No acute or significant extra-articular osseous findings.

Other: No other significant periarticular soft tissue findings.
IMPRESSION: 1. No evidence of patellar tendon injury, internal derangement or
other acute intra-articular findings. The menisci, cruciate and
collateral ligaments are intact.
2. Central trochlear chondromalacia.
3. Lateral subcutaneous edema superficial to the iliotibial band
without focal fluid collection.

## 2020-08-15 ENCOUNTER — Emergency Department (HOSPITAL_COMMUNITY)
Admission: EM | Admit: 2020-08-15 | Discharge: 2020-08-15 | Disposition: A | Discharge status: Home / Self Care | Payer: BC Managed Care – PPO | Attending: Emergency Medicine | Admitting: Emergency Medicine

## 2020-08-15 ENCOUNTER — Other Ambulatory Visit: Payer: Self-pay

## 2020-08-15 ENCOUNTER — Encounter (HOSPITAL_COMMUNITY): Payer: Self-pay

## 2020-08-15 DIAGNOSIS — I10 Essential (primary) hypertension: Secondary | ICD-10-CM | POA: Insufficient documentation

## 2020-08-15 DIAGNOSIS — H9209 Otalgia, unspecified ear: Secondary | ICD-10-CM | POA: Insufficient documentation

## 2020-08-15 DIAGNOSIS — K0889 Other specified disorders of teeth and supporting structures: Secondary | ICD-10-CM | POA: Insufficient documentation

## 2020-08-15 MED ORDER — AMOXICILLIN 500 MG PO CAPS
500.0000 mg | ORAL_CAPSULE | Freq: Once | ORAL | Status: AC
  Administered 2020-08-15: 500 mg via ORAL
  Filled 2020-08-15: qty 1

## 2020-08-15 MED ORDER — IBUPROFEN 800 MG PO TABS: 800.0000 mg | ORAL_TABLET | Freq: Four times a day (QID) | ORAL | 0 refills | Status: AC | PRN

## 2020-08-15 MED ORDER — AMOXICILLIN 500 MG PO CAPS: 500.0000 mg | ORAL_CAPSULE | Freq: Three times a day (TID) | ORAL | 0 refills | Status: DC

## 2020-08-15 MED ORDER — HYDROCODONE-ACETAMINOPHEN 5-325 MG PO TABS
1.0000 | ORAL_TABLET | Freq: Once | ORAL | Status: AC
  Administered 2020-08-15: 1 via ORAL
  Filled 2020-08-15: qty 1

## 2020-08-15 MED ORDER — IBUPROFEN 800 MG PO TABS
800.0000 mg | ORAL_TABLET | Freq: Once | ORAL | Status: AC
  Administered 2020-08-15: 800 mg via ORAL
  Filled 2020-08-15: qty 1

## 2020-08-15 NOTE — ED Provider Notes (Signed)
[facility] EMERGENCY DEPARTMENT Provider Note   CSN: [id number] Arrival date & time: 08/15/20  0121     History Chief Complaint  Patient presents with  . Otalgia  . Dental Pain    Nathaniel Lawrence is a 40 y.o. male.  Patient presents to the emergency department for evaluation of dental pain.  Patient reports that he has been having pain on the right lower side of his mouth for about a week.  Pain worsened tonight.  No facial swelling or fever.  Pain seems to be coming from a specific tooth on the right side of his jaw but he has not identified any cavities or broken teeth in the area.        Past Medical History:  Diagnosis Date  . Hypertension   . Myocardial infarction acute (HCC)     There are no problems to display for this patient.   History reviewed. No pertinent surgical history.     History reviewed. No pertinent family history.  Social History   Tobacco Use  . Smoking status: Never Smoker  . Smokeless tobacco: Never Used  Substance Use Topics  . Alcohol use: No  . Drug use: No    Home Medications Prior to Admission medications   Medication Sig Start Date End Date Taking? Authorizing Provider  amoxicillin (AMOXIL) 500 MG capsule Take 1 capsule (500 mg total) by mouth 3 (three) times daily. 08/15/20  Yes Cristi Gwynn, Canary Brim, MD  ibuprofen (ADVIL) 800 MG tablet Take 1 tablet (800 mg total) by mouth every 6 (six) hours as needed for moderate pain. 08/15/20  Yes Dayten Juba, Canary Brim, MD    Allergies    Patient has no known allergies.  Review of Systems   Review of Systems  Constitutional: Negative for fever.  HENT: Positive for dental problem.     Physical Exam Updated Vital Signs BP (!) 166/111 (BP Location: Left Arm)   Pulse (!) 57   Temp 98.4 F (36.9 C) (Oral)   Resp 17   Ht 5\' 9"  (1.753 m)   Wt 89.8 kg   SpO2 98%   BMI 29.24 kg/m   Physical Exam Vitals and nursing note reviewed.  Constitutional:      General: He  is not in acute distress.    Appearance: Normal appearance. He is well-developed.  HENT:     Head: Normocephalic and atraumatic.     Right Ear: Hearing normal.     Left Ear: Hearing normal.     Nose: Nose normal.     Mouth/Throat:   Eyes:     Conjunctiva/sclera: Conjunctivae normal.     Pupils: Pupils are equal, round, and reactive to light.  Cardiovascular:     Rate and Rhythm: Regular rhythm.     Heart sounds: S1 normal and S2 normal. No murmur heard. No friction rub. No gallop.   Pulmonary:     Effort: Pulmonary effort is normal. No respiratory distress.     Breath sounds: Normal breath sounds.  Chest:     Chest wall: No tenderness.  Abdominal:     General: Bowel sounds are normal.     Palpations: Abdomen is soft.     Tenderness: There is no abdominal tenderness. There is no guarding or rebound. Negative signs include Murphy's sign and McBurney's sign.     Hernia: No hernia is present.  Musculoskeletal:        General: Normal range of motion.     Cervical back: Normal range  of motion and neck supple.  Skin:    General: Skin is warm and dry.     Findings: No rash.  Neurological:     Mental Status: He is alert and oriented to person, place, and time.     GCS: GCS eye subscore is 4. GCS verbal subscore is 5. GCS motor subscore is 6.     Cranial Nerves: No cranial nerve deficit.     Sensory: No sensory deficit.     Coordination: Coordination normal.  Psychiatric:        Speech: Speech normal.        Behavior: Behavior normal.        Thought Content: Thought content normal.     ED Results / Procedures / Treatments   Labs (all labs ordered are listed, but only abnormal results are displayed) Labs Reviewed - No data to display  EKG None  Radiology No results found.  Procedures Procedures   Medications Ordered in ED Medications  HYDROcodone-acetaminophen (NORCO/VICODIN) 5-325 MG per tablet 1 tablet (has no administration in time range)  ibuprofen (ADVIL)  tablet 800 mg (has no administration in time range)  amoxicillin (AMOXIL) capsule 500 mg (has no administration in time range)    ED Course  I have reviewed the triage vital signs and the nursing notes.  Pertinent labs & imaging results that were available during my care of the patient were reviewed by me and considered in my medical decision making (see chart for details).    MDM Rules/Calculators/A&P                          Patient presents with dental problem.  Patient with progressively worsening tooth ache for 1 week.  No evidence of facial abscess, dental abscess.  No evidence of Ludewig's angina.  Patient appears well.  Final Clinical Impression(s) / ED Diagnoses Final diagnoses:  Pain, dental    Rx / DC Orders ED Discharge Orders         Ordered    amoxicillin (AMOXIL) 500 MG capsule  3 times daily        08/15/20 0247    ibuprofen (ADVIL) 800 MG tablet  Every 6 hours PRN        08/15/20 0247           Gilda Crease, MD 08/15/20 [phone]

## 2020-08-15 NOTE — ED Triage Notes (Signed)
Patient states R sided ear and dental pain x 1 week

## 2021-02-18 ENCOUNTER — Emergency Department (HOSPITAL_COMMUNITY)
Admission: EM | Admit: 2021-02-18 | Discharge: 2021-02-18 | Discharge status: Against Medical Advice | Payer: BC Managed Care – PPO

## 2021-02-18 NOTE — ED Notes (Signed)
Called for triage and unable to locate pt

## 2021-02-19 ENCOUNTER — Other Ambulatory Visit: Payer: Self-pay

## 2021-02-19 ENCOUNTER — Emergency Department (HOSPITAL_COMMUNITY)
Admission: EM | Admit: 2021-02-19 | Discharge: 2021-02-20 | Disposition: A | Discharge status: Home / Self Care | Payer: BC Managed Care – PPO | Attending: Emergency Medicine | Admitting: Emergency Medicine

## 2021-02-19 ENCOUNTER — Encounter (HOSPITAL_COMMUNITY): Payer: Self-pay | Admitting: Emergency Medicine

## 2021-02-19 DIAGNOSIS — K0889 Other specified disorders of teeth and supporting structures: Secondary | ICD-10-CM | POA: Diagnosis present

## 2021-02-19 DIAGNOSIS — I10 Essential (primary) hypertension: Secondary | ICD-10-CM | POA: Diagnosis not present

## 2021-02-19 MED ORDER — FENTANYL CITRATE PF 50 MCG/ML IJ SOSY: 50.0000 ug | PREFILLED_SYRINGE | Freq: Once | INTRAMUSCULAR | Status: DC

## 2021-02-19 NOTE — ED Provider Notes (Signed)
Emergency Medicine Provider Triage Evaluation Note  Nathaniel Lawrence , a 40 y.o. male  was evaluated in triage.  Pt complains of right sided dental pain x 3 days, 10/10, has been alternating motrin, tylenol without relief. Took 1000mg  tylenol 1 hour PTA. Hx of cavity filled at this location. No odd taste in mouth, no fever, chills.  Review of Systems  Positive: Dental pain Negative: Fevers, chills  Physical Exam  BP (!) 156/102 (BP Location: Right Arm)   Pulse 66   Temp 98.9 F (37.2 C) (Oral)   Resp 18   SpO2 99%  Gen:   Awake, holding right side of face in pain  Resp:  Normal effort  MSK:   Moves extremities without difficulty  Other:  Approx 1 mm vascular looking papule on right lower gums near premolar  Medical Decision Making  Medically screening exam initiated at 8:19 PM.  Appropriate orders placed.  Nathaniel Lawrence was informed that the remainder of the evaluation will be completed by another provider, this initial triage assessment does not replace that evaluation, and the importance of remaining in the ED until their evaluation is complete.  Dental pain, abscess vs other mass   Erma Pinto, PA-C 02/19/21 2021    2022, MD 02/20/21 1103

## 2021-02-19 NOTE — ED Triage Notes (Signed)
Pt reports "knot" to his right lower gum x 3 days.

## 2021-02-20 MED ORDER — KETOROLAC TROMETHAMINE 30 MG/ML IJ SOLN
30.0000 mg | Freq: Once | INTRAMUSCULAR | Status: DC
  Filled 2021-02-20: qty 1

## 2021-02-20 MED ORDER — PENICILLIN V POTASSIUM 500 MG PO TABS: 500.0000 mg | ORAL_TABLET | Freq: Four times a day (QID) | ORAL | 0 refills | Status: AC

## 2021-02-20 MED ORDER — PENICILLIN V POTASSIUM 250 MG PO TABS
500.0000 mg | ORAL_TABLET | Freq: Once | ORAL | Status: AC
  Administered 2021-02-20: 500 mg via ORAL
  Filled 2021-02-20: qty 2

## 2021-02-20 MED ORDER — IBUPROFEN 800 MG PO TABS
800.0000 mg | ORAL_TABLET | Freq: Once | ORAL | Status: AC
  Administered 2021-02-20: 800 mg via ORAL

## 2021-02-20 NOTE — ED Provider Notes (Signed)
[facility] EMERGENCY DEPARTMENT Provider Note   CSN: [id number] Arrival date & time: 02/19/21  1957     History   Chief Complaint Chief Complaint  Patient presents with   Dental Pain    HPI Nathaniel Lawrence is a 40 y.o. male.  Patient presents to the emergency department with a dental complaint. Symptoms began 2-3 days ago. The patient has tried to alleviate pain with OTC meds.  Pain rated as severe, characterized as throbbing in nature and located right lower premolar. Patient denies fever, night sweats, chills, difficulty swallowing or opening mouth, SOB, nuchal rigidity or decreased ROM of neck.  Patient does not have a dentist and requests a resource guide at discharge.      The history is provided by the patient. No language interpreter was used.   Past Medical History:  Diagnosis Date   Hypertension    Myocardial infarction acute (HCC)     There are no problems to display for this patient.   History reviewed. No pertinent surgical history.      Home Medications    Prior to Admission medications   Medication Sig Start Date End Date Taking? Authorizing Provider  penicillin v potassium (VEETID) 500 MG tablet Take 1 tablet (500 mg total) by mouth 4 (four) times daily. 02/20/21  Yes Roxy Horseman, PA-C  ibuprofen (ADVIL) 800 MG tablet Take 1 tablet (800 mg total) by mouth every 6 (six) hours as needed for moderate pain. 08/15/20   Gilda Crease, MD    Family History No family history on file.  Social History Social History   Tobacco Use   Smoking status: Never   Smokeless tobacco: Never  Substance Use Topics   Alcohol use: No   Drug use: No     Allergies   Patient has no known allergies.   Review of Systems Review of Systems  Constitutional:  Negative for chills and fever.  HENT:  Positive for dental problem. Negative for drooling.   Neurological:  Negative for speech difficulty.  Psychiatric/Behavioral:  Positive for  sleep disturbance.     Physical Exam Updated Vital Signs BP (!) 145/99   Pulse 89   Temp 98.9 F (37.2 C) (Oral)   Resp 17   SpO2 98%   Physical Exam Physical Exam  Constitutional: Pt appears well-developed and well-nourished.  HENT:  Head: Normocephalic.  Mouth/Throat: Uvula is midline, oropharynx is clear and moist and mucous membranes are normal. No oral lesions. No uvula swelling or lacerations. No oropharyngeal exudate, posterior oropharyngeal edema, posterior oropharyngeal erythema or tonsillar abscesses.  Poor dentition No gingival swelling, fluctuance or induration No gross abscess  No sublingual edema, tenderness to palpation, or sign of Ludwig's angina, or deep space infection Pain at right lower premolar Eyes: Conjunctivae are normal. Pupils are equal, round, and reactive to light. Right eye exhibits no discharge. Left eye exhibits no discharge.  Neck: Normal range of motion. Neck supple.  No stridor Handling secretions without difficulty No nuchal rigidity No cervical lymphadenopathy Pulmonary/Chest: Effort normal. No respiratory distress.  Equal chest rise  Lymphadenopathy: Pt has no cervical adenopathy.  Neurological: Pt is alert and oriented x 4  Skin: Skin is warm and dry.  Psychiatric: Pt has a normal mood and affect.  Nursing note and vitals reviewed.   ED Treatments / Results  Labs (all labs ordered are listed, but only abnormal results are displayed) Labs Reviewed - No data to display  EKG    Radiology No results  found.  Procedures Procedures (including critical care time)  Medications Ordered in ED Medications  fentaNYL (SUBLIMAZE) injection 50 mcg (has no administration in time range)  ketorolac (TORADOL) 30 MG/ML injection 30 mg (has no administration in time range)  penicillin v potassium (VEETID) tablet 500 mg (has no administration in time range)     Initial Impression / Assessment and Plan / ED Course  I have reviewed the triage  vital signs and the nursing notes.  Pertinent labs & imaging results that were available during my care of the patient were reviewed by me and considered in my medical decision making (see chart for details).        Patient with dentalgia.  No abscess requiring immediate incision and drainage.  Exam not concerning for Ludwig's angina or pharyngeal abscess.  Will treat with pencillin. Pt instructed to follow-up with dentist.  Discussed return precautions. Pt safe for discharge.   Final Clinical Impressions(s) / ED Diagnoses   Final diagnoses:  Pain, dental    ED Discharge Orders          Ordered    penicillin v potassium (VEETID) 500 MG tablet  4 times daily        02/20/21 0049             Roxy Horseman, PA-C 02/20/21 0051    Melene Plan, DO 02/20/21 [phone]

## 2023-09-24 ENCOUNTER — Emergency Department (HOSPITAL_COMMUNITY)

## 2023-09-24 ENCOUNTER — Other Ambulatory Visit: Payer: Self-pay

## 2023-09-24 ENCOUNTER — Encounter (HOSPITAL_COMMUNITY): Payer: Self-pay

## 2023-09-24 ENCOUNTER — Emergency Department (HOSPITAL_COMMUNITY)
Admission: EM | Admit: 2023-09-24 | Discharge: 2023-09-24 | Disposition: A | Discharge status: Home / Self Care | Attending: Emergency Medicine | Admitting: Emergency Medicine

## 2023-09-24 DIAGNOSIS — I252 Old myocardial infarction: Secondary | ICD-10-CM | POA: Insufficient documentation

## 2023-09-24 DIAGNOSIS — I2510 Atherosclerotic heart disease of native coronary artery without angina pectoris: Secondary | ICD-10-CM | POA: Diagnosis not present

## 2023-09-24 DIAGNOSIS — R0789 Other chest pain: Secondary | ICD-10-CM | POA: Insufficient documentation

## 2023-09-24 DIAGNOSIS — M79602 Pain in left arm: Secondary | ICD-10-CM | POA: Insufficient documentation

## 2023-09-24 DIAGNOSIS — I10 Essential (primary) hypertension: Secondary | ICD-10-CM | POA: Diagnosis not present

## 2023-09-24 DIAGNOSIS — Z955 Presence of coronary angioplasty implant and graft: Secondary | ICD-10-CM | POA: Diagnosis not present

## 2023-09-24 LAB — BASIC METABOLIC PANEL WITH GFR
Anion gap: 7 (ref 5–15)
BUN: 7 mg/dL (ref 6–20)
CO2: 27 mmol/L (ref 22–32)
Calcium: 9 mg/dL (ref 8.9–10.3)
Chloride: 105 mmol/L (ref 98–111)
Creatinine, Ser: 1.31 mg/dL — ABNORMAL HIGH (ref 0.61–1.24)
GFR, Estimated: >60 mL/min (ref >60)
Glucose, Bld: 90 mg/dL (ref 70–99)
Potassium: 3.6 mmol/L (ref 3.5–5.1)
Sodium: 139 mmol/L (ref 135–145)

## 2023-09-24 LAB — CBC
HCT: 46.4 % (ref 39.0–52.0)
Hemoglobin: 15.5 g/dL (ref 13.0–17.0)
MCH: 28.3 pg (ref 26.0–34.0)
MCHC: 33.4 g/dL (ref 30.0–36.0)
MCV: 84.7 fL (ref 80.0–100.0)
Platelets: 197 10*3/uL (ref 150–400)
RBC: 5.48 MIL/uL (ref 4.22–5.81)
RDW: 12 % (ref 11.5–15.5)
WBC: 7.8 10*3/uL (ref 4.0–10.5)
nRBC: 0 % (ref 0.0–0.2)

## 2023-09-24 LAB — TROPONIN I (HIGH SENSITIVITY)
Troponin I (High Sensitivity): 18 ng/L — ABNORMAL HIGH (ref <18)
Troponin I (High Sensitivity): 13 ng/L (ref <18)

## 2023-09-24 MED ORDER — KETOROLAC TROMETHAMINE 15 MG/ML IJ SOLN
15.0000 mg | Freq: Once | INTRAMUSCULAR | Status: AC
  Administered 2023-09-24: 15 mg via INTRAVENOUS
  Filled 2023-09-24: qty 1

## 2023-09-24 MED ORDER — LIDOCAINE 5 % EX PTCH
1.0000 | MEDICATED_PATCH | CUTANEOUS | Status: DC
  Administered 2023-09-24: 1 via TRANSDERMAL
  Filled 2023-09-24: qty 1

## 2023-09-24 MED ORDER — ETODOLAC 400 MG PO TABS: 400.0000 mg | ORAL_TABLET | Freq: Two times a day (BID) | ORAL | 0 refills | Status: AC

## 2023-09-24 MED ORDER — LIDOCAINE 5 % EX PTCH: 1.0000 | MEDICATED_PATCH | CUTANEOUS | 0 refills | Status: AC

## 2023-09-24 MED ORDER — ALUM & MAG HYDROXIDE-SIMETH 200-200-20 MG/5ML PO SUSP
30.0000 mL | Freq: Once | ORAL | Status: AC
  Administered 2023-09-24: 30 mL via ORAL
  Filled 2023-09-24: qty 30

## 2023-09-24 NOTE — ED Notes (Signed)
 Patient verbalizes understanding of discharge instructions. Opportunity for questioning and answers were provided. Pt discharged from ED.

## 2023-09-24 NOTE — ED Triage Notes (Addendum)
 Complaining of chest pain in the center chest that started last week. Took baking soda and water and it went away. Yesterday it started again and has gotten worse as the night has progressed. He said that it hurts worse to move the left arm.

## 2023-09-24 NOTE — ED Provider Notes (Signed)
 [place] EMERGENCY DEPARTMENT AT [facility] Provider Note   CSN: [id number] Arrival date & time: 09/24/23  0358     History  Chief Complaint  Patient presents with   Chest Pain    Nathaniel Lawrence is a 43 y.o. male.  43 year old male presents today for concern of left-sided chest pain as well as pain over the left arm.  He states that he has history of CAD and believes that he might of had a stent put in years ago at [facility].  Not able to find this in chart review in Care Everywhere.  He states the pain started about a week ago.  Initially went away after he took some baking soda and water however this returned last night.  Denies any associated shortness of breath, lightheadedness, palpitations or other anginal symptoms.  As long as he does not move including moving his left arm he is pain-free.  The history is provided by the patient. No language interpreter was used.       Home Medications Prior to Admission medications   Medication Sig Start Date End Date Taking? Authorizing Provider  ibuprofen  (ADVIL ) 800 MG tablet Take 1 tablet (800 mg total) by mouth every 6 (six) hours as needed for moderate pain. 08/15/20   Ballard Bongo, MD  penicillin  v potassium (VEETID) 500 MG tablet Take 1 tablet (500 mg total) by mouth 4 (four) times daily. 02/20/21   Sherel Dikes, PA-C      Allergies    Patient has no known allergies.    Review of Systems   Review of Systems  Constitutional:  Negative for chills and fever.  Respiratory:  Negative for shortness of breath.   Cardiovascular:  Positive for chest pain. Negative for leg swelling.  Musculoskeletal:  Positive for arthralgias and myalgias.  All other systems reviewed and are negative.   Physical Exam Updated Vital Signs BP (!) 153/99   Pulse 61   Temp 98.1 F (36.7 C) (Oral)   Resp 12   Ht 5' 10 (1.778 m)   Wt 85.7 kg   SpO2 100%   BMI 27.12 kg/m  Physical Exam Vitals and nursing note reviewed.   Constitutional:      General: He is not in acute distress.    Appearance: Normal appearance. He is not ill-appearing.  HENT:     Head: Normocephalic and atraumatic.     Nose: Nose normal.  Eyes:     Conjunctiva/sclera: Conjunctivae normal.  Cardiovascular:     Rate and Rhythm: Normal rate and regular rhythm.  Pulmonary:     Effort: Pulmonary effort is normal. No respiratory distress.     Breath sounds: No wheezing.  Abdominal:     Palpations: Abdomen is soft.  Musculoskeletal:        General: No deformity. Normal range of motion.     Right lower leg: No edema.     Left lower leg: No edema.  Skin:    Findings: No rash.  Neurological:     Mental Status: He is alert.     ED Results / Procedures / Treatments   Labs (all labs ordered are listed, but only abnormal results are displayed) Labs Reviewed  BASIC METABOLIC PANEL WITH GFR - Abnormal; Notable for the following components:      Result Value   Creatinine, Ser 1.31 (*)    All other components within normal limits  TROPONIN I (HIGH SENSITIVITY) - Abnormal; Notable for the following components:  Troponin I (High Sensitivity) 18 (*)    All other components within normal limits  CBC  TROPONIN I (HIGH SENSITIVITY)    EKG EKG Interpretation Date/Time:  Monday September 24 2023 04:07:13 EDT Ventricular Rate:  64 PR Interval:  156 QRS Duration:  90 QT Interval:  388 QTC Calculation: 400 R Axis:   91  Text Interpretation: Normal sinus rhythm Rightward axis Confirmed by Guadalupe Lee ([id number]) on 09/24/2023 7:00:29 AM  Radiology DG Chest 2 View Result Date: 09/24/2023 CLINICAL DATA:  Chest pain on the left for 1 week, initial encounter EXAM: CHEST - 2 VIEW COMPARISON:  02/23/2015 FINDINGS: The heart size and mediastinal contours are within normal limits. Both lungs are clear. The visualized skeletal structures are unremarkable. IMPRESSION: No active cardiopulmonary disease. Electronically Signed   By: Violeta Grey M.D.   On:  09/24/2023 04:26    Procedures Procedures    Medications Ordered in ED Medications  lidocaine  (LIDODERM ) 5 % 1 patch (1 patch Transdermal Patch Applied 09/24/23 0751)  alum & mag hydroxide-simeth (MAALOX/MYLANTA) 200-200-20 MG/5ML suspension 30 mL (30 mLs Oral Given 09/24/23 0752)  ketorolac  (TORADOL ) 15 MG/ML injection 15 mg (15 mg Intravenous Given 09/24/23 0751)    ED Course/ Medical Decision Making/ A&P                                 Medical Decision Making Amount and/or Complexity of Data Reviewed Labs: ordered. Radiology: ordered.  Risk OTC drugs. Prescription drug management.   Medical Decision Making / ED Course   This patient presents to the ED for concern of chest pain, this involves an extensive number of treatment options, and is a complaint that carries with it a high risk of complications and morbidity.  The differential diagnosis includes ACS, PE, pneumonia, MSK pain  MDM: 43 year old male presents today for concern of chest pain.  Reports history of ACS but I am unable to find this in chart review. He states he had potentially a stent placed but he is not positive. Pain is worse with any movement.  Particularly with movement of left arm. Pain-free at rest. Troponin initially 18 repeat 13.  CBC without acute concern.  BMP with creatinine 1.31 otherwise without acute concern.  Chest x-ray without acute cardiopulmonary process.  EKG without acute ischemic change. No injury over the left upper extremity. On reevaluation he does report improvement.  Still some pain with movement or range of motion of the left arm. Symptomatic management discussed.  Will give cardiology referral as well. Discharged in stable condition.  Patient voices understanding and is in agreement with plan.  Additional history obtained: -Additional history obtained from chart review -External records from outside source obtained and reviewed including: Chart review including previous notes,  labs, imaging, consultation notes   Lab Tests: -I ordered, reviewed, and interpreted labs.   The pertinent results include:   Labs Reviewed  BASIC METABOLIC PANEL WITH GFR - Abnormal; Notable for the following components:      Result Value   Creatinine, Ser 1.31 (*)    All other components within normal limits  TROPONIN I (HIGH SENSITIVITY) - Abnormal; Notable for the following components:   Troponin I (High Sensitivity) 18 (*)    All other components within normal limits  CBC  TROPONIN I (HIGH SENSITIVITY)      EKG  EKG Interpretation Date/Time:  Monday September 24 2023 04:07:13 EDT Ventricular Rate:  64  PR Interval:  156 QRS Duration:  90 QT Interval:  388 QTC Calculation: 400 R Axis:   91  Text Interpretation: Normal sinus rhythm Rightward axis Confirmed by Guadalupe Lee ([id number]) on 09/24/2023 7:00:29 AM         Imaging Studies ordered: I ordered imaging studies including chest x-ray I independently visualized and interpreted imaging. I agree with the radiologist interpretation   Medicines ordered and prescription drug management: Meds ordered this encounter  Medications   lidocaine  (LIDODERM ) 5 % 1 patch   alum & mag hydroxide-simeth (MAALOX/MYLANTA) 200-200-20 MG/5ML suspension 30 mL   ketorolac  (TORADOL ) 15 MG/ML injection 15 mg    -I have reviewed the patients home medicines and have made adjustments as needed   Reevaluation: After the interventions noted above, I reevaluated the patient and found that they have :improved  Co morbidities that complicate the patient evaluation  Past Medical History:  Diagnosis Date   Hypertension    Myocardial infarction acute (HCC)       Dispostion: Discharged in stable condition.  Return precaution discussed.  Patient voices understanding and is in agreement with the plan.   Final Clinical Impression(s) / ED Diagnoses Final diagnoses:  Atypical chest pain  Left arm pain    Rx / DC Orders ED Discharge Orders           Ordered    etodolac  (LODINE ) 400 MG tablet  2 times daily        09/24/23 1022    lidocaine  (LIDODERM ) 5 %  Every 24 hours        09/24/23 1022    Ambulatory referral to Cardiology       Comments: If you have not heard from the Cardiology office within the next 72 hours please call [phone].   09/24/23 1022              Lucina Sabal, PA-C 09/24/23 1023    Guadalupe Lee, MD 10/02/23 [phone]

## 2023-09-24 NOTE — Discharge Instructions (Signed)
 Your workup today was reassuring.  Your pain appears consistent with a musculoskeletal pain.  I have sent a couple medicines into the pharmacy.  Lodine is an anti-inflammatory medicine.  Do not combine this with ibuprofen  or Aleve .  I have given you cardiology referral.  Follow-up with your primary care doctor as well.  Return for any emergent symptoms.
